# Patient Record
Sex: Male | Born: 1955 | Race: White | Marital: Married | Smoking: Former smoker
Health system: Southern US, Community
[De-identification: ages and names within clinical notes are randomized; demographics above are authoritative.]

## PROBLEM LIST (undated history)

## (undated) DIAGNOSIS — C801 Malignant (primary) neoplasm, unspecified: Secondary | ICD-10-CM

## (undated) DIAGNOSIS — I10 Essential (primary) hypertension: Secondary | ICD-10-CM

## (undated) DIAGNOSIS — N189 Chronic kidney disease, unspecified: Secondary | ICD-10-CM

## (undated) DIAGNOSIS — K5792 Diverticulitis of intestine, part unspecified, without perforation or abscess without bleeding: Secondary | ICD-10-CM

## (undated) DIAGNOSIS — M1990 Unspecified osteoarthritis, unspecified site: Secondary | ICD-10-CM

## (undated) DIAGNOSIS — M109 Gout, unspecified: Secondary | ICD-10-CM

## (undated) HISTORY — DX: Gout, unspecified: M10.9

## (undated) HISTORY — PX: NASAL ENDOSCOPY: SHX6577

## (undated) HISTORY — PX: KNEE ARTHROSCOPY: SHX127

---

## 2002-08-07 ENCOUNTER — Encounter: Payer: Self-pay | Admitting: Urology

## 2002-08-07 ENCOUNTER — Encounter: Admission: RE | Admit: 2002-08-07 | Discharge: 2002-08-07 | Payer: Self-pay | Admitting: Urology

## 2002-08-21 ENCOUNTER — Encounter: Payer: Self-pay | Admitting: Internal Medicine

## 2002-08-21 ENCOUNTER — Encounter: Admission: RE | Admit: 2002-08-21 | Discharge: 2002-08-21 | Payer: Self-pay | Admitting: Internal Medicine

## 2002-10-12 HISTORY — PX: LAPAROSCOPIC SIGMOID COLECTOMY: SHX5928

## 2002-11-03 ENCOUNTER — Encounter: Admission: RE | Admit: 2002-11-03 | Discharge: 2002-11-03 | Payer: Self-pay | Admitting: Internal Medicine

## 2002-11-03 ENCOUNTER — Encounter: Payer: Self-pay | Admitting: Internal Medicine

## 2003-04-03 ENCOUNTER — Encounter: Payer: Self-pay | Admitting: General Surgery

## 2003-04-03 ENCOUNTER — Encounter: Admission: RE | Admit: 2003-04-03 | Discharge: 2003-04-03 | Payer: Self-pay | Admitting: General Surgery

## 2003-05-11 ENCOUNTER — Encounter: Payer: Self-pay | Admitting: General Surgery

## 2003-05-18 ENCOUNTER — Inpatient Hospital Stay (HOSPITAL_COMMUNITY): Admission: RE | Admit: 2003-05-18 | Discharge: 2003-05-22 | Payer: Self-pay | Admitting: General Surgery

## 2003-05-18 ENCOUNTER — Encounter (INDEPENDENT_AMBULATORY_CARE_PROVIDER_SITE_OTHER): Payer: Self-pay | Admitting: Specialist

## 2009-01-04 ENCOUNTER — Ambulatory Visit: Payer: Self-pay | Admitting: Internal Medicine

## 2009-01-04 DIAGNOSIS — Z860100 Personal history of colon polyps, unspecified: Secondary | ICD-10-CM | POA: Insufficient documentation

## 2009-01-04 DIAGNOSIS — E785 Hyperlipidemia, unspecified: Secondary | ICD-10-CM | POA: Insufficient documentation

## 2009-01-04 DIAGNOSIS — F411 Generalized anxiety disorder: Secondary | ICD-10-CM | POA: Insufficient documentation

## 2009-01-04 DIAGNOSIS — E669 Obesity, unspecified: Secondary | ICD-10-CM | POA: Insufficient documentation

## 2009-01-04 DIAGNOSIS — M109 Gout, unspecified: Secondary | ICD-10-CM | POA: Insufficient documentation

## 2009-01-04 DIAGNOSIS — J309 Allergic rhinitis, unspecified: Secondary | ICD-10-CM | POA: Insufficient documentation

## 2009-01-04 DIAGNOSIS — F528 Other sexual dysfunction not due to a substance or known physiological condition: Secondary | ICD-10-CM | POA: Insufficient documentation

## 2009-01-04 DIAGNOSIS — I10 Essential (primary) hypertension: Secondary | ICD-10-CM | POA: Insufficient documentation

## 2009-02-05 ENCOUNTER — Ambulatory Visit: Payer: Self-pay | Admitting: Internal Medicine

## 2009-02-05 LAB — CONVERTED CEMR LAB
ALT: 19 units/L (ref 0–53)
AST: 15 units/L (ref 0–37)
Albumin: 4.4 g/dL (ref 3.5–5.2)
Alkaline Phosphatase: 47 units/L (ref 39–117)
BUN: 21 mg/dL (ref 6–23)
Basophils Absolute: 0 10*3/uL (ref 0.0–0.1)
Basophils Relative: 0.5 % (ref 0.0–3.0)
Bilirubin, Direct: 0 mg/dL (ref 0.0–0.3)
CO2: 29 meq/L (ref 19–32)
Calcium: 9.7 mg/dL (ref 8.4–10.5)
Chloride: 107 meq/L (ref 96–112)
Cholesterol: 146 mg/dL (ref 0–200)
Creatinine, Ser: 1 mg/dL (ref 0.4–1.5)
Eosinophils Absolute: 0.2 10*3/uL (ref 0.0–0.7)
Eosinophils Relative: 3.8 % (ref 0.0–5.0)
GFR calc non Af Amer: 82.85 mL/min (ref >60)
Glucose, Bld: 99 mg/dL (ref 70–99)
HCT: 39.3 % (ref 39.0–52.0)
HDL: 28.9 mg/dL — ABNORMAL LOW (ref >39.00)
Hemoglobin: 13.8 g/dL (ref 13.0–17.0)
Hgb A1c MFr Bld: 5.4 % (ref 4.6–6.5)
LDL Cholesterol: 78 mg/dL (ref 0–99)
Lymphocytes Relative: 29.6 % (ref 12.0–46.0)
Lymphs Abs: 1.5 10*3/uL (ref 0.7–4.0)
MCHC: 35 g/dL (ref 30.0–36.0)
MCV: 94.5 fL (ref 78.0–100.0)
Monocytes Absolute: 0.5 10*3/uL (ref 0.1–1.0)
Monocytes Relative: 9.8 % (ref 3.0–12.0)
Neutro Abs: 3 10*3/uL (ref 1.4–7.7)
Neutrophils Relative %: 56.3 % (ref 43.0–77.0)
Platelets: 224 10*3/uL (ref 150.0–400.0)
Potassium: 4.9 meq/L (ref 3.5–5.1)
RBC: 4.16 M/uL — ABNORMAL LOW (ref 4.22–5.81)
RDW: 12.3 % (ref 11.5–14.6)
Sodium: 142 meq/L (ref 135–145)
TSH: 1.57 microintl units/mL (ref 0.35–5.50)
Total Bilirubin: 1.2 mg/dL (ref 0.3–1.2)
Total CHOL/HDL Ratio: 5
Total Protein: 7.6 g/dL (ref 6.0–8.3)
Triglycerides: 198 mg/dL — ABNORMAL HIGH (ref 0.0–149.0)
VLDL: 39.6 mg/dL (ref 0.0–40.0)
WBC: 5.2 10*3/uL (ref 4.5–10.5)

## 2009-02-08 ENCOUNTER — Ambulatory Visit: Payer: Self-pay | Admitting: Internal Medicine

## 2009-08-12 ENCOUNTER — Telehealth: Payer: Self-pay | Admitting: Internal Medicine

## 2009-08-13 ENCOUNTER — Ambulatory Visit: Payer: Self-pay | Admitting: Internal Medicine

## 2009-08-14 ENCOUNTER — Encounter: Payer: Self-pay | Admitting: Internal Medicine

## 2009-08-14 LAB — CONVERTED CEMR LAB
ALT: 10 units/L (ref 0–53)
AST: 12 units/L (ref 0–37)
Albumin: 4.6 g/dL (ref 3.5–5.2)
Alkaline Phosphatase: 42 units/L (ref 39–117)
Bilirubin, Direct: 0.1 mg/dL (ref 0.0–0.3)
Cholesterol: 130 mg/dL (ref 0–200)
HDL: 33 mg/dL — ABNORMAL LOW (ref >39)
Indirect Bilirubin: 0.3 mg/dL (ref 0.0–0.9)
LDL Cholesterol: 46 mg/dL (ref 0–99)
Total Bilirubin: 0.4 mg/dL (ref 0.3–1.2)
Total CHOL/HDL Ratio: 3.9
Total Protein: 7 g/dL (ref 6.0–8.3)
Triglycerides: 254 mg/dL — ABNORMAL HIGH (ref <150)
VLDL: 51 mg/dL — ABNORMAL HIGH (ref 0–40)

## 2009-08-16 ENCOUNTER — Ambulatory Visit: Payer: Self-pay | Admitting: Internal Medicine

## 2010-01-17 ENCOUNTER — Encounter: Payer: Self-pay | Admitting: Internal Medicine

## 2010-01-17 LAB — CONVERTED CEMR LAB
BUN: 19 mg/dL (ref 6–23)
CO2: 23 meq/L (ref 19–32)
Calcium: 9 mg/dL (ref 8.4–10.5)
Chloride: 105 meq/L (ref 96–112)
Cholesterol: 133 mg/dL (ref 0–200)
Creatinine, Ser: 1.02 mg/dL (ref 0.40–1.50)
Glucose, Bld: 114 mg/dL — ABNORMAL HIGH (ref 70–99)
HDL: 30 mg/dL — ABNORMAL LOW (ref >39)
LDL Cholesterol: 49 mg/dL (ref 0–99)
Potassium: 4.2 meq/L (ref 3.5–5.3)
Sodium: 140 meq/L (ref 135–145)
Total CHOL/HDL Ratio: 4.4
Triglycerides: 270 mg/dL — ABNORMAL HIGH (ref <150)
VLDL: 54 mg/dL — ABNORMAL HIGH (ref 0–40)

## 2010-01-27 ENCOUNTER — Ambulatory Visit: Payer: Self-pay | Admitting: Internal Medicine

## 2010-01-27 DIAGNOSIS — R7309 Other abnormal glucose: Secondary | ICD-10-CM | POA: Insufficient documentation

## 2010-03-11 ENCOUNTER — Telehealth: Payer: Self-pay | Admitting: Internal Medicine

## 2010-04-28 ENCOUNTER — Encounter: Payer: Self-pay | Admitting: Internal Medicine

## 2010-04-28 LAB — CONVERTED CEMR LAB
BUN: 27 mg/dL — ABNORMAL HIGH (ref 6–23)
Calcium: 9 mg/dL (ref 8.4–10.5)
Cholesterol: 123 mg/dL (ref 0–200)
Glucose, Bld: 113 mg/dL — ABNORMAL HIGH (ref 70–99)
Potassium: 4.4 meq/L (ref 3.5–5.3)
Sodium: 141 meq/L (ref 135–145)
Triglycerides: 313 mg/dL — ABNORMAL HIGH (ref <150)

## 2010-04-29 ENCOUNTER — Telehealth: Payer: Self-pay | Admitting: Internal Medicine

## 2010-05-01 ENCOUNTER — Telehealth: Payer: Self-pay | Admitting: Internal Medicine

## 2010-05-02 ENCOUNTER — Ambulatory Visit: Payer: Self-pay | Admitting: Internal Medicine

## 2010-05-08 ENCOUNTER — Telehealth: Payer: Self-pay | Admitting: Internal Medicine

## 2010-05-09 ENCOUNTER — Telehealth (INDEPENDENT_AMBULATORY_CARE_PROVIDER_SITE_OTHER): Payer: Self-pay | Admitting: *Deleted

## 2010-08-11 ENCOUNTER — Encounter: Payer: Self-pay | Admitting: Internal Medicine

## 2010-10-22 ENCOUNTER — Telehealth: Payer: Self-pay | Admitting: Internal Medicine

## 2010-10-24 LAB — CONVERTED CEMR LAB
BUN: 41 mg/dL — ABNORMAL HIGH (ref 6–23)
Bilirubin, Direct: <0.1 mg/dL (ref 0.0–0.3)
CO2: 21 meq/L (ref 19–32)
Glucose, Bld: 120 mg/dL — ABNORMAL HIGH (ref 70–99)
Hgb A1c MFr Bld: 5.9 % — ABNORMAL HIGH (ref <5.7)
Potassium: 5 meq/L (ref 3.5–5.3)
Sodium: 141 meq/L (ref 135–145)
TSH: 1.719 microintl units/mL (ref 0.350–4.500)
Total Bilirubin: 0.4 mg/dL (ref 0.3–1.2)
Total CHOL/HDL Ratio: 5.5

## 2010-10-27 ENCOUNTER — Ambulatory Visit
Admission: RE | Admit: 2010-10-27 | Discharge: 2010-10-27 | Payer: Self-pay | Source: Home / Self Care | Attending: Internal Medicine | Admitting: Internal Medicine

## 2010-11-10 ENCOUNTER — Encounter: Payer: Self-pay | Admitting: Internal Medicine

## 2010-11-11 ENCOUNTER — Telehealth: Payer: Self-pay | Admitting: Internal Medicine

## 2010-11-11 LAB — CONVERTED CEMR LAB
Chloride: 105 meq/L (ref 96–112)
Creatinine, Ser: 1.12 mg/dL (ref 0.40–1.50)
Potassium: 4.2 meq/L (ref 3.5–5.3)
Sodium: 137 meq/L (ref 135–145)

## 2010-11-11 NOTE — Progress Notes (Signed)
Summary: Metformin clarification  Phone Note From Pharmacy   Caller: [facility] Call For: Dr Artist Pais  Summary of Call: Recieved fax from [facility] requesting clarification on patient's Metformin rx.  They received refill on 7/21 for three times a day dosing and refill 7/22 for two times a day dosing.  Spoke to Merrill @ [phone] re: order [id number] and verified that per office note on 05/02/10 Dr Artist Pais changed directions to 1 tablet two times a day.  Arlys John states that the script from 7/21 had already been shipped to pt and they would add new directions to pt's profile for future refills.  Spoke to pt and advised him that directions on bottle will be incorrect and to only take med two times a day as directed by Dr. Artist Pais on 05/02/10.  Pt voices understanding.  Nicki Guadalajara Fergerson CMA Duncan Dull)  May 08, 2010 2:49 PM

## 2010-11-11 NOTE — Miscellaneous (Signed)
Summary: Orders Update  Clinical Lists Changes  Orders: Added new Test order of TLB-Lipid Panel ([id number]-LIPID) - Signed Added new Test order of TLB-BMP (Basic Metabolic Panel-BMET) (80048-METABOL) - Signed

## 2010-11-11 NOTE — Progress Notes (Signed)
Summary: mail order--allopurinol, lisinopril, metformin & lorazepam  Phone Note Refill Request Message from:  Fax from [facility] on May 01, 2010 10:56 AM  Refills Requested: Medication #1:  LISINOPRIL 40 MG TABS Take 1 tablet by mouth every morning   Dosage confirmed as above?Dosage Confirmed   Supply Requested: 3 months  Medication #2:  ALLOPURINOL 300 MG TABS Take 1 tablet by mouth once a day   Dosage confirmed as above?Dosage Confirmed   Supply Requested: 3 months  Medication #3:  METFORMIN HCL 500 MG TABS 1/2 by mouth two times a day x 7 days   Dosage confirmed as above?Dosage Confirmed   Supply Requested: 3 months  Medication #4:  LORAZEPAM 1 MG TABS 1 to one tab by mouth qd   Dosage confirmed as above?Dosage Confirmed   Supply Requested: 3 months Next Appointment Scheduled: 05/02/10  Dr Artist Pais Initial call taken by: Mervin Kung CMA Duncan Dull),  May 01, 2010 10:57 AM    Prescriptions: LORAZEPAM 1 MG TABS (LORAZEPAM) 1 to one tab by mouth qd  #90 x 0   Entered by:   Mervin Kung CMA (AAMA)   Authorized by:   D. Thomos Lemons DO   Signed by:   Mervin Kung CMA (AAMA) on 05/01/2010   Method used:   Telephoned to ...       [address] [facility] (mail-order)       [address]       [place], [place]  [address]       Ph: [phone]       Fax: [phone]   RxID:   [id number] METFORMIN HCL 500 MG TABS (METFORMIN HCL) 1/2 by mouth two times a day x 7 days,  then one by mouth three times a day  #270 x 0   Entered by:   Mervin Kung CMA (AAMA)   Authorized by:   D. Thomos Lemons DO   Signed by:   Mervin Kung CMA (AAMA) on 05/01/2010   Method used:   Faxed to ...       [address] [facility] (mail-order)       [address]       [place], [place]  [address]       Ph: [phone]       Fax: [phone]   RxID:   [id number] ALLOPURINOL 300 MG TABS (ALLOPURINOL) Take 1 tablet by mouth once a day  #90 x 0   Entered by:   Mervin Kung CMA (AAMA)   Authorized by:   D.  Thomos Lemons DO   Signed by:   Mervin Kung CMA (AAMA) on 05/01/2010   Method used:   Faxed to ...       [facility] (mail-order)       [address]       [place], [place]  [address]       Ph: [phone]       Fax: [phone]   RxID:   [id number] LISINOPRIL 40 MG TABS (LISINOPRIL) Take 1 tablet by mouth every morning  #90 x 0   Entered by:   Mervin Kung CMA (AAMA)   Authorized by:   D. Thomos Lemons DO   Signed by:   Mervin Kung CMA (AAMA) on 05/01/2010   Method used:   Faxed to ...       [facility] (mail-order)       [address]       [place], [place]  [address]  Ph: [phone]       Fax: [phone]   RxID:   [id number]

## 2010-11-11 NOTE — Miscellaneous (Signed)
Summary: Medication Refills  Clinical Lists Changes  Medications: Rx of CHLORTHALIDONE 25 MG TABS (CHLORTHALIDONE) Take 1 tablet by mouth every morning;  #90 x 1;  Signed;  Entered by: Glendell Docker CMA;  Authorized by: D. Thomos Lemons DO;  Method used: Telephoned to [facility], [place], [place]  [address], Ph: [phone], Fax: [phone] Rx of CITALOPRAM HYDROBROMIDE 20 MG TABS (CITALOPRAM HYDROBROMIDE) Take 1 tablet by mouth every morning;  #90 x 1;  Signed;  Entered by: Glendell Docker CMA;  Authorized by: D. Thomos Lemons DO;  Method used: Telephoned to [facility], [place], [place]  [address], Ph: [phone], Fax: [phone]    Prescriptions: CITALOPRAM HYDROBROMIDE 20 MG TABS (CITALOPRAM HYDROBROMIDE) Take 1 tablet by mouth every morning  #90 x 1   Entered by:   Glendell Docker CMA   Authorized by:   D. Thomos Lemons DO   Signed by:   Glendell Docker CMA on 08/11/2010   Method used:   Telephoned to ...       [facility] (mail-order)       [address]       [place], [place]  [address]       Ph: [phone]       Fax: [phone]   RxID:   [id number] CHLORTHALIDONE 25 MG TABS (CHLORTHALIDONE) Take 1 tablet by mouth every morning  #90 x 1   Entered by:   Glendell Docker CMA   Authorized by:   D. Thomos Lemons DO   Signed by:   Glendell Docker CMA on 08/11/2010   Method used:   Telephoned to ...       [address] [facility] (mail-order)       [address]       [place], [place]  [address]       Ph: [phone]       Fax: [phone]   RxID:   [id number]

## 2010-11-11 NOTE — Assessment & Plan Note (Signed)
Summary: 6 month follow up/mhf, resched- jr   Vital Signs:  Patient profile:   55 year old male Height:      70.5 inches Weight:      296.50 pounds BMI:     42.09 O2 Sat:      99 % on Room air Temp:     97.9 degrees F oral Pulse rate:   68 / minute Pulse rhythm:   regular Resp:     20 per minute BP sitting:   132 / 60  (right arm) Cuff size:   large  Vitals Entered By: Glendell Docker CMA (January 27, 2010 11:43 AM)  O2 Flow:  Room air CC: Rm 2-  6 month follow up disease management Comments discuss labs   Primary Care Provider:  D. Thomos Lemons DO  CC:  Rm 2-  6 month follow up disease management.  History of Present Illness:  Hyperlipidemia Follow-Up      This is a 55 year old man who presents for Hyperlipidemia follow-up.  The patient denies muscle aches and abdominal pain.  The patient denies the following symptoms: chest pain/pressure.  Compliance with medications (by patient report) has been intermittent.  Dietary compliance has been poor.  The patient reports no exercise.    htn - stable  obesity - no dietary change.  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   Neck:  No deformities, masses, or tenderness noted.no carotid bruits.   Lungs:  normal respiratory effort and normal breath sounds.   Heart:   normal rate, regular rhythm, no murmur, and no gallop.   Abdomen:  soft, non-tender, and normal bowel sounds.   Extremities:  trace left pedal edema and trace right pedal edema.   Neurologic:  cranial nerves II-XII intact and gait normal.   Psych:  normally interactive, good eye contact, not anxious appearing, and not depressed appearing.     Impression & Recommendations:  Problem # 1:  HYPERGLYCEMIA (ICD-790.29) Pt counseled on diet and exercise.  add metformin  His updated medication list for this problem includes:    Metformin Hcl 500 Mg Tabs (Metformin hcl) .Marland Kitchen... 1/2 by mouth two times a day x 7 days,  then one by mouth three times a day  Labs Reviewed: Creat: 1.02 (01/17/2010)     Problem # 2:  OBESITY, UNSPECIFIED (ICD-278.00) pt with additional wt gain.  wt loss strategies reviewed  Ht: 70.5 (01/27/2010)   Wt: 296.50 (01/27/2010)   BMI: 42.09 (01/27/2010)  Problem # 3:  HYPERTENSION (ICD-401.9) stable.  Maintain current medication regimen.  His updated medication list for this problem includes:    Chlorthalidone 25 Mg Tabs (Chlorthalidone) .Marland Kitchen... Take 1 tablet by mouth every morning    Lisinopril 40 Mg Tabs (Lisinopril) .Marland Kitchen... Take 1 tablet by mouth every morning  BP today: 132/60 Prior BP: 140/70 (08/16/2009)  Labs Reviewed:  K+: 4.2 (01/17/2010) Creat: : 1.02 (01/17/2010)   Chol: 133 (01/17/2010)   HDL: 30 (01/17/2010)   LDL: 49 (01/17/2010)   TG: 270 (01/17/2010)  Problem # 4:  HYPERLIPIDEMIA (ICD-272.4) I suspect hyperglycemia exacerbating elevated triglycerides.   hopefully metformin and lifestyle changes will help  His updated medication list for this problem includes:    Gemfibrozil 600 Mg Tabs (Gemfibrozil) .Marland Kitchen... Take 1 tablet by mouth two times a day    Lovaza 1 Gm Caps (Omega-3-acid ethyl esters) .Marland Kitchen... 2 caps by mouth bid  Labs Reviewed: SGOT: 12 (08/14/2009)   SGPT: 10 (08/14/2009)   HDL:30 (01/17/2010), 33 (08/14/2009)  LDL:49 (01/17/2010), 46  (56/21/3086)  Chol:133 (01/17/2010), 130 (08/14/2009)  Trig:270 (01/17/2010), 254 (08/14/2009)  Complete Medication List: 1)  Chlorthalidone 25 Mg Tabs (Chlorthalidone) .... Take 1 tablet by mouth every morning 2)  Lisinopril 40 Mg Tabs (Lisinopril) .... Take 1 tablet by mouth every morning 3)  Allopurinol 300 Mg Tabs (Allopurinol) .... Take 1 tablet by mouth once a day 4)  Citalopram Hydrobromide 20 Mg Tabs (Citalopram hydrobromide) .... Take 1 tablet by mouth every morning 5)  Gemfibrozil 600 Mg Tabs (Gemfibrozil) .... Take 1 tablet by mouth two times a day 6)  Lorazepam 1 Mg Tabs (Lorazepam) .Marland Kitchen.. 1 to one tab by mouth qd 7)  Fluticasone Propionate 50 Mcg/act Susp (Fluticasone propionate) .... One spray each nostril as needed once daily 8)  Triam .1cr1/cetaphil Lot3  .... Apply after bathing 9)  Metformin Hcl 500 Mg Tabs (Metformin hcl) .... 1/2 by mouth two times a day x 7 days,  then one by mouth three times a day 10)  Lovaza 1 Gm Caps (Omega-3-acid ethyl esters) .... 2 caps by mouth bid  Patient Instructions: 1)  Please schedule a follow-up appointment in 3 months. 2)  BMP prior to visit, ICD-9:  401.9 3)  HbgA1C prior to visit, ICD-9:  272.4 4)  FLP:  272.4 5)  Please return for lab work one (1) week before your next appointment.  Prescriptions: LOVAZA 1 GM CAPS (OMEGA-3-ACID ETHYL ESTERS) 2 caps by mouth bid  #120 x 3   Entered and Authorized by:   D. Thomos Lemons DO   Signed by:   D. Thomos Lemons DO on 01/27/2010   Method used:   Electronically to        [facility] [address] [facility] (retail)       [address]       [place], [place]  [address]       Ph: [phone] or [phone]       Fax: [phone]   RxID:   [id number] METFORMIN HCL 500 MG TABS (METFORMIN HCL) 1/2 by mouth two times a day x 7 days,  then one by mouth three times a day  #90 x 2   Entered and Authorized by:   D. Thomos Lemons DO   Signed by:   D. Thomos Lemons DO on 01/27/2010   Method  used:   Electronically to        [facility] [address] [facility] (retail)       [address]       [place], [place]  [address]       Ph: [phone] or [phone]       Fax: [phone]   RxID:   [id number]

## 2010-11-11 NOTE — Progress Notes (Signed)
Summary: refill-- chlorthalidone, citalopram and Lovaza  Phone Note Refill Request Message from:  Fax from [facility]  Fax [phone] on April 29, 2010 12:07 PM  Refills Requested: Medication #1:  CHLORTHALIDONE 25 MG TABS Take 1 tablet by mouth every morning   Supply Requested: 3 months  Medication #2:  CITALOPRAM HYDROBROMIDE 20 MG TABS Take 1 tablet by mouth every morning   Dosage confirmed as above?Dosage Confirmed   Supply Requested: 3 months  Medication #3:  LOVAZA 1 GM CAPS 2 caps by mouth bid.   Dosage confirmed as above?Dosage Confirmed   Supply Requested: 3 months Requesting 3 month supply with additional refills.  Next Appointment Scheduled: 05/02/10--Dr Artist Pais Initial call taken by: Mervin Kung CMA Duncan Dull),  April 29, 2010 12:08 PM  Follow-up for Phone Call        ok for refill  Follow-up by: D. Thomos Lemons DO,  April 29, 2010 12:14 PM    Prescriptions: LOVAZA 1 GM CAPS (OMEGA-3-ACID ETHYL ESTERS) 2 caps by mouth bid  #360 x 0   Entered by:   Mervin Kung CMA (AAMA)   Authorized by:   D. Thomos Lemons DO   Signed by:   Mervin Kung CMA (AAMA) on 04/29/2010   Method used:   Faxed to ...       [facility] (mail-order)       [address]       [place], [place]  [address]       Ph: [phone]       Fax: [phone]   RxID:   [id number] CITALOPRAM HYDROBROMIDE 20 MG TABS (CITALOPRAM HYDROBROMIDE) Take 1 tablet by mouth every morning  #90 x 0   Entered by:   Mervin Kung CMA (AAMA)   Authorized by:   D. Thomos Lemons DO   Signed by:   Mervin Kung CMA (AAMA) on 04/29/2010   Method used:   Faxed to ...       [facility] (mail-order)       [address]       [place], [place]  [address]       Ph: [phone]       Fax: [phone]   RxID:   [id number] CHLORTHALIDONE 25 MG TABS (CHLORTHALIDONE) Take 1 tablet by mouth every morning  #90 x 0   Entered by:   Mervin Kung CMA (AAMA)   Authorized by:   D. Thomos Lemons DO   Signed by:    Mervin Kung CMA (AAMA) on 04/29/2010   Method used:   Faxed to ...       [address] [facility] (mail-order)       [address]       [place], [place]  [address]       Ph: [phone]       Fax: [phone]   RxID:   [id number]

## 2010-11-11 NOTE — Miscellaneous (Signed)
Summary: Lab Orders  Clinical Lists Changes  Orders: Added new Test order of T-Basic Metabolic Panel [id number]) - Signed Added new Test order of T- Hemoglobin A1C ([id number]) - Signed Added new Test order of T-Lipid Profile ([id number]) - Signed

## 2010-11-11 NOTE — Progress Notes (Signed)
Summary: lorazepam refill  Phone Note Refill Request Message from:  Received fax from [facility] [phone] on Mar 11, 2010 10:58 AM  Refills Requested: Medication #1:  LORAZEPAM 1 MG TABS 1 to one tab by mouth qd   Supply Requested: 1 month  Follow-up for Phone Call        ok to refill x 3 Follow-up by: D. Thomos Lemons DO,  March 12, 2010 12:14 PM  Additional Follow-up for Phone Call Additional follow up Details #1::        Left message on pharmacy voicemail ok to add 2 additional refills per Dr. Artist Pais.  Nicki Guadalajara Fergerson CMA  March 12, 2010 2:17 PM     Prescriptions: LORAZEPAM 1 MG TABS (LORAZEPAM) 1 to one tab by mouth qd  #30 x 1   Entered by:   Mervin Kung CMA   Authorized by:   D. Thomos Lemons DO   Signed by:   Mervin Kung CMA on 03/11/2010   Method used:   Telephoned to ...       [facility] [address] (retail)       [address]       [place], [place]  [address]       Ph: [phone] or [phone]       Fax: [phone]   RxID:   [id number]

## 2010-11-11 NOTE — Progress Notes (Signed)
Summary: Gemfibrozil  Phone Note Call from Patient Call back at [phone]   Caller: Patient Summary of Call: Pt states that [facility] does not show Gemfibrozil on their list of meds he received Initial call taken by: Lannette Donath,  May 09, 2010 12:12 PM  Follow-up for Phone Call        Spoke to [place] at [facility] and she verified they received our rx on 05/02/10. Med was shipped in a separate order and pt should receive it around Monday or Tuesday next week.  Pt notified.  Nicki Guadalajara Fergerson CMA Duncan Dull)  May 09, 2010 2:59 PM

## 2010-11-11 NOTE — Assessment & Plan Note (Signed)
Summary: 3 month follow up/mhf-- Rm 2   Vital Signs:  Patient profile:   55 year old male Height:      70.5 inches Weight:      299.25 pounds BMI:     42.48 Temp:     97.6 degrees F oral Pulse rate:   90 / minute Pulse rhythm:   regular Resp:     18 per minute BP sitting:   138 / 62  (right arm) Cuff size:   large  Vitals Entered By: Mervin Kung CMA Duncan Dull) (May 02, 2010 3:37 PM) CC: Room  2  3 month follow up. Pt will need local refill on Allopurinol. Is Patient Diabetic? Yes   Primary Care Provider:  Dondra Spry DO  CC:  Room  2  3 month follow up. Pt will need local refill on Allopurinol.Marland Kitchen  History of Present Illness:  Hypertension Follow-Up      This is a 55 year old man who presents for Hypertension follow-up.  The patient denies lightheadedness and edema.  The patient denies the following associated symptoms: chest pain.  Compliance with medications (by patient report) has been near 100%.  The patient reports that dietary compliance has been fair.  The patient reports no exercise.    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   Neck:  No deformities, masses, or tenderness noted.no carotid bruits.   Lungs:  normal respiratory effort and normal breath sounds.   Heart:  normal rate, regular rhythm, no murmur, and no gallop.     Extremities:  trace left pedal edema and trace right pedal edema.     Impression & Recommendations:  Problem # 1:  OBESITY, UNSPECIFIED (ICD-278.00) Assessment Unchanged reviewed wt loss strategies.  pt travels often and can't stick to wt watchers.  wife is supportive Ht: 70.5 (05/02/2010)   Wt: 299.25 (05/02/2010)   BMI: 42.48 (05/02/2010)  Problem # 2:  HYPERTENSION (ICD-401.9) Assessment: Unchanged Maintain current medication regimen.  His updated medication list for this problem includes:    Chlorthalidone 25 Mg Tabs (Chlorthalidone) .Marland Kitchen... Take 1 tablet by mouth every morning    Lisinopril 40 Mg Tabs (Lisinopril) .Marland Kitchen... Take 1 tablet by mouth every morning    Furosemide 20 Mg Tabs (Furosemide) ..... One by mouth qd  Future Orders: T-Basic Metabolic Panel [id number]) ... 10/14/2010  BP today: 138/62 Prior BP: 132/60 (01/27/2010)  Labs Reviewed: K+: 4.4 (04/28/2010) Creat: : 0.90 (04/28/2010)   Chol: 123 (04/28/2010)   HDL: 27 (04/28/2010)   LDL: 33 (04/28/2010)   TG: 313 (04/28/2010)  Problem # 3:  GOUT (ICD-274.9) quiescent.  Maintain current medication regimen.  His updated medication list for this problem includes:    Allopurinol 300 Mg Tabs (Allopurinol) .Marland Kitchen... Take 1 tablet by mouth once a day  Complete Medication List: 1)  Chlorthalidone 25 Mg Tabs (Chlorthalidone) .Marland KitchenMarland KitchenMarland Kitchen  Take 1 tablet by mouth every morning 2)  Lisinopril 40 Mg Tabs (Lisinopril) .... Take 1 tablet by mouth every morning 3)  Allopurinol 300 Mg Tabs (Allopurinol) .... Take 1 tablet by mouth once a day 4)  Citalopram Hydrobromide 20 Mg Tabs (Citalopram hydrobromide) .... Take 1 tablet by mouth every morning 5)  Gemfibrozil 600 Mg Tabs (Gemfibrozil) .... Take 1 tablet by mouth two times a day 6)  Lorazepam 1 Mg Tabs (Lorazepam) .Marland Kitchen.. 1 to one tab by mouth qd 7)  Fluticasone Propionate 50 Mcg/act Susp (Fluticasone propionate) .... One spray each nostril as needed once daily 8)  Triam .1cr1/cetaphil Lot3   .... Apply after bathing 9)  Metformin Hcl 500 Mg Tabs (Metformin hcl) .... One by mouth two times a day 10)  Lovaza 1 Gm Caps (Omega-3-acid ethyl esters) .... 2 caps by mouth bid 11)  Furosemide 20 Mg Tabs (Furosemide) .... One by mouth qd  Other Orders: Future Orders: T- Hemoglobin A1C ([id number]) ... 10/14/2010  Patient Instructions: 1)  Please schedule a follow-up appointment in 6 months. 2)  BMP prior to visit, ICD-9:  401.9 3)  HbgA1C prior to visit, ICD-9: 790.29 4)  Please return for lab work one (1) week before your next appointment.  Prescriptions: METFORMIN HCL 500 MG TABS (METFORMIN HCL) one by mouth two times a day  #180 x 1   Entered and Authorized by:   D. Thomos Lemons DO   Signed by:   D. Thomos Lemons DO on 05/02/2010   Method used:   Faxed to ...       [address] [facility] (mail-order)       [address]       [place], [place]  [address]       Ph: [phone]       Fax: [phone]   RxID:   [id number] LORAZEPAM 1 MG TABS (LORAZEPAM) 1 to one tab by mouth qd  #90 x 1   Entered and Authorized by:   D. Thomos Lemons DO   Signed by:   D. Thomos Lemons DO on 05/02/2010   Method used:   Print then Mail to Patient   RxID:   [id number] LOVAZA 1 GM CAPS (OMEGA-3-ACID ETHYL ESTERS) 2 caps by mouth bid  #360 x 1   Entered and Authorized by:   D. Thomos Lemons DO   Signed by:   D. Thomos Lemons DO on 05/02/2010   Method used:   Faxed to ...       [address] [facility] (mail-order)       [address]       [place], [place]  [address]       Ph: [phone]       Fax: [phone]   RxID:   [id number] GEMFIBROZIL 600 MG TABS (GEMFIBROZIL) Take 1 tablet by mouth two times a day  #180 x 1   Entered and Authorized by:   D. Thomos Lemons DO   Signed by:   D. Thomos Lemons DO on 05/02/2010   Method used:   Faxed to ...       [facility] (mail-order)       [address]       [place], [place]  [address]       Ph: [phone]       Fax: [phone]   RxID:   [id number] CITALOPRAM HYDROBROMIDE 20 MG TABS  (CITALOPRAM HYDROBROMIDE) Take 1 tablet by mouth every morning  #90 x 1   Entered  and Authorized by:   D. Thomos Lemons DO   Signed by:   D. Thomos Lemons DO on 05/02/2010   Method used:   Faxed to ...       [address] [facility] (mail-order)       [address]       [place], [place]  [address]       Ph: [phone]       Fax: [phone]   RxID:   [id number] ALLOPURINOL 300 MG TABS (ALLOPURINOL) Take 1 tablet by mouth once a day  #90 x 1   Entered and Authorized by:   D. Thomos Lemons DO   Signed by:   D. Thomos Lemons DO on 05/02/2010   Method used:   Faxed to ...       [facility] (mail-order)       [address]       [place], [place]  [address]       Ph: [phone]       Fax: [phone]   RxID:   [id number] LISINOPRIL 40 MG TABS (LISINOPRIL) Take 1 tablet by mouth every morning  #90 x 1   Entered and Authorized by:   D. Thomos Lemons DO   Signed by:   D. Thomos Lemons DO on 05/02/2010   Method used:   Faxed to ...       [facility] (mail-order)       [address]       [place], [place]  [address]       Ph: [phone]       Fax: [phone]   RxID:   [id number] CHLORTHALIDONE 25 MG TABS (CHLORTHALIDONE) Take 1 tablet by mouth every morning  #90 x 1   Entered and Authorized by:   D. Thomos Lemons DO   Signed by:   D. Thomos Lemons DO on 05/02/2010   Method used:   Faxed to ...       [address] [facility] (mail-order)       [address]       [place], [place]  [address]       Ph: [phone]       Fax: [phone]   RxID:   [id number] FUROSEMIDE 20 MG TABS (FUROSEMIDE) one by mouth qd  #90 x 1   Entered and Authorized by:   D. Thomos Lemons DO   Signed by:   D. Thomos Lemons DO on 05/02/2010   Method used:   Electronically to        [facility] (retail)       [address]       [place], [place]  [address]       Ph: [phone] or [phone]       Fax: [phone]   RxID:   [id number]   Current Allergies (reviewed today): No known allergies

## 2010-11-11 NOTE — Medication Information (Signed)
Summary: Nonadherence with Gemfibrozil/[employer]  Nonadherence with Gemfibrozil/[employer]   Imported By: Lanelle Bal 08/18/2010 14:15:36  _____________________________________________________________________  External Attachment:    Type:   Image     Comment:   External Document

## 2010-11-13 NOTE — Progress Notes (Signed)
Summary: Bllod Work  ---- [facility] [name] ---- ---- 10/20/2010 1:37 PM, D. Thomos Lemons DO wrote: plz have pt complete labs before OV  BMP prior to visit, ICD-9:  272.4 Hepatic Panel prior to visit, ICD-9:  272.4 Lipid Panel prior to visit, ICD-9:  272.4 TSH prior to visit, ICD-9: 272.4 ------------------------------  Phone Note Outgoing Call   Call placed by: Glendell Docker CMA,  October 22, 2010 8:18 AM Call placed to: Patient Summary of Call: call was placed to patient at [phone], no answer. A detailed voice message  was left for informing patient per Dr Artist Pais instructions. Patient was informed blood is fasting and the need to have before appointment on 1/16. Message was left for patient to call if any questions. Lab work has been entered for [employer] Initial call taken by: Glendell Docker CMA,  October 22, 2010 8:21 AM

## 2010-11-13 NOTE — Assessment & Plan Note (Signed)
Summary: 6 month follow up/mhf   Vital Signs:  Patient profile:   55 year old male Height:      70.5 inches Weight:      310.25 pounds BMI:     44.05 O2 Sat:      98 % on Room air Temp:     97.6 degrees F oral Pulse rate:   93 / minute Resp:     20 per minute BP sitting:   120 / 60  (right arm) Cuff size:   large  Vitals Entered By: Glendell Docker CMA (October 27, 2010 8:11 AM)  O2 Flow:  Room air CC: 6 Month Follow up Is Patient Diabetic? No Pain Assessment Patient in pain? no      Comments c/o difficulty staying asleep, discuss medications, feels they are not working, restless and jittery during the day, and increase in wt gain   Primary Care Provider:  D. Thomos Lemons DO  CC:  6 Month Follow up.  History of Present Illness: 55 y/o white male for follow up reviewed lab results with pt his Cr is elevated pt has been taking aleve two times a day for chronic knee OA   Preventive Screening-Counseling & Management  Alcohol-Tobacco     Smoking Status: quit  Allergies (verified): No Known Drug Allergies  Past History:  Past Medical History: Diverticulosis Hx of sigmoid diverticulitis Gout   Hypertension   Colonic polyps, hx of (Tubular adenoma - 10/2002) Allergic rhinitis Hyperlipidemia  Generalized Anxiety D/O  DJD of left knee - severe  Past Surgical History: Laproscopic sigmoid colectomy 05/2003  Left Knee arthroscopy 08/2008 - Dr Lajoyce Corners      Social History: 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   Lungs:  normal respiratory effort and normal breath sounds.   Heart:  normal rate, regular rhythm, no murmur, and no gallop.   Extremities:  trace left pedal edema and trace right pedal edema.   Neurologic:  cranial nerves II-XII intact and gait normal.     Impression &  Recommendations:  Problem # 1:  HYPERTENSION (ICD-401.9) Cr.  worse due to NSAID use.  decrease lisinopril dose.  increase fluid intake.  hold lasix pt advised to stop using NSAIDs  His updated medication list for this problem includes:    Chlorthalidone 25 Mg Tabs (Chlorthalidone) .Marland Kitchen... Take 1 tablet by mouth every morning    Lisinopril 40 Mg Tabs (Lisinopril) .Marland Kitchen... Take 1/2  tablet by mouth every morning    Furosemide 20 Mg Tabs (Furosemide) ..... One by mouth qd  BP today: 120/60 Prior BP: 138/62 (05/02/2010)  Labs Reviewed: K+: 4.4 (04/28/2010) Creat: : 0.90 (04/28/2010)   Chol: 123 (04/28/2010)   HDL: 27 (04/28/2010)   LDL: 33 (04/28/2010)   TG: 313 (04/28/2010)  Problem # 2:  HYPERGLYCEMIA (ICD-790.29) hold metformin until Cr normalizes His updated medication list for this problem includes:    Metformin Hcl 500 Mg Tabs (Metformin hcl) ..... One by mouth two times a day  Labs Reviewed: Creat: 0.90 (04/28/2010)     Problem # 3:  HYPERLIPIDEMIA (ICD-272.4)  His updated medication list for this problem includes:    Gemfibrozil 600 Mg Tabs (Gemfibrozil) .Marland Kitchen... Take 1 tablet by mouth two times a day    Lovaza 1 Gm Caps (Omega-3-acid  ethyl esters) .Marland Kitchen... 2 caps by mouth bid  Labs Reviewed: SGOT: 12 (08/14/2009)   SGPT: 10 (08/14/2009)   HDL:27 (04/28/2010), 30 (01/17/2010)  LDL:33 (04/28/2010), 49 (01/17/2010)  Chol:123 (04/28/2010), 133 (01/17/2010)  Trig:313 (04/28/2010), 270 (01/17/2010)  Complete Medication List: 1)  Chlorthalidone 25 Mg Tabs (Chlorthalidone) .... Take 1 tablet by mouth every morning 2)  Lisinopril 40 Mg Tabs (Lisinopril) .... Take 1/2  tablet by mouth every morning 3)  Allopurinol 300 Mg Tabs (Allopurinol) .... Take 1 tablet by mouth once a day 4)  Citalopram Hydrobromide 20 Mg Tabs (Citalopram hydrobromide) .... Take 1 tablet by mouth every morning 5)  Gemfibrozil 600 Mg Tabs (Gemfibrozil) .... Take 1 tablet by mouth two times a day 6)  Lorazepam 1 Mg  Tabs (Lorazepam) .Marland Kitchen.. 1 to one tab by mouth qd 7)  Fluticasone Propionate 50 Mcg/act Susp (Fluticasone propionate) .... One spray each nostril as needed once daily 8)  Triam .1cr1/cetaphil Lot3  .... Apply after bathing 9)  Metformin Hcl 500 Mg Tabs (Metformin hcl) .... One by mouth two times a day 10)  Lovaza 1 Gm Caps (Omega-3-acid ethyl esters) .... 2 caps by mouth bid 11)  Furosemide 20 Mg Tabs (Furosemide) .... One by mouth qd  Patient Instructions: 1)  Hold Lasix 2)  Take 1/2 of lisinopril 3)  Hold Metformin  4)  Stop all NSAIDs (aleve) 5)  Please schedule a follow-up appointment in 1 month. 6)  BMP prior to visit, ICD-9:  790.29 7)  Please return for lab work 2 weeks   Orders Added: 1)  Est. Patient Level III [16109]   Immunization History:  Influenza Immunization History:    Influenza:  declined (10/27/2010)  Tetanus/Td Immunization History:    Tetanus/Td:  historical (01/17/2008)   Contraindications/Deferment of Procedures/Staging:    Test/Procedure: FLU VAX    Reason for deferment: patient declined   Immunization History:  Influenza Immunization History:    Influenza:  Declined (10/27/2010)  Tetanus/Td Immunization History:    Tetanus/Td:  Historical (01/17/2008)  Current Allergies (reviewed today): No known allergies

## 2010-11-19 NOTE — Miscellaneous (Signed)
Summary: Orders Update  Clinical Lists Changes  Orders: Added new Test order of T-Basic Metabolic Panel ([id number]) - Signed  

## 2010-11-19 NOTE — Progress Notes (Signed)
Summary: Medication Questions  Phone Note Call from Patient Call back at [phone]   Caller: Patient Call For: D. Thomos Lemons DO Summary of Call: patient called and left voice message after being informed of his lab results. He would like to know if he is to continue with taking the medication that was prescribed by Dr Artist Pais Initial call taken by: Glendell Docker CMA,  November 11, 2010 2:31 PM  Follow-up for Phone Call        ok to resume metformin ok to take full dose of lisinopril continue to hold lasix I suggest f/u OV with 2-4 weeks Follow-up by: D. Thomos Lemons DO,  November 11, 2010 4:27 PM  Additional Follow-up for Phone Call Additional follow up Details #1::        call returned to patient at [phone], he has been informed per Dr Artist Pais instructions. Additional Follow-up by: Glendell Docker CMA,  November 11, 2010 4:37 PM    New/Updated Medications: LISINOPRIL 40 MG TABS (LISINOPRIL) Take 1  tablet by mouth every morning METFORMIN HCL 500 MG TABS (METFORMIN HCL) one by mouth two times a day

## 2010-11-28 ENCOUNTER — Ambulatory Visit: Payer: Self-pay | Admitting: Internal Medicine

## 2011-02-27 NOTE — H&P (Signed)
   NAME:  Randy [name], Randy [name]                      ACCOUNT NO.:  [id number]   MEDICAL RECORD NO.:  [id number]                   PATIENT TYPE:  INP   LOCATION:  0003                                 FACILITY:  [facility]   PHYSICIAN:  Adolph Pollack, M.D.            DATE OF BIRTH:  April 11, 1956   DATE OF ADMISSION:  05/18/2003  DATE OF DISCHARGE:                                HISTORY & PHYSICAL   REASON FOR ADMISSION:  Elective laparoscopic sigmoid colectomy.   HISTORY OF PRESENT ILLNESS:  Randy [name] is a 55 year old male who had  recurrent bouts of sigmoid diverticulitis.  He had been treated with  antibiotics.  He was seen by Iva Boop, M.D. [facility].  A CT scan has  documented the inflammatory process and the barium enema demonstrates a  segmental involvement of the sigmoid colon with diverticular disease.  Colonoscopy also demonstrates sigmoid diverticular disease and some  hemorrhoids and benign polyps.  Because of his recurrent bouts he presents  for elective partial colectomy.  Procedure and the risks were discussed with  him in detail preoperatively.   PAST MEDICAL HISTORY:  1. Sinus disease.  2. Diverticulosis with recurrent diverticulitis.  3. Hypertension for hemorrhoids.  4. Arthritis.   MEDICATIONS:  1. Lisinopril 10 mg daily.  2. He takes a number of p.r.n. medicines for sinus disease including pseudo     ephedrine.  3. Etodolac.   SOCIAL HISTORY:  No tobacco use.  He has three to four alcoholic beverages  daily.  He works for [employer].   FAMILY HISTORY:  Positive for mother who has rectal cancer.  Father has  heart disease.   PHYSICAL EXAMINATION:  GENERAL:  Moderately obese male in no acute distress.  Pleasant, cooperative.  HEENT:  Eyes:  Extraocular movements are intact.  No icterus.  NECK:  Supple without palpable masses or thyroid enlargement.  RESPIRATORY:  Breath sounds equal and clear.  Respirations unlabored.  CARDIOVASCULAR:  Heart  demonstrates a regular rate and rhythm.  No murmur  heard.  No lower extremity edema.  ABDOMEN:  Soft, nontender, nondistended.  No scars noted.  No hernias.  No  palpable masses or organomegaly.  MUSCULOSKELETAL:  Good muscle tone and full range of motion.    IMPRESSION:  Recurrent sigmoid diverticulitis.   PLAN:  Laparoscopic possible open sigmoid colectomy.                                               Adolph Pollack, M.D.    Kari Baars  D:  05/18/2003  T:  05/18/2003  Job:  [id number]   cc:   Iva Boop, M.D. [facility]   Mosetta Putt, M.D.  [address] [place]  [place] [address]  Fax: [phone]

## 2011-02-27 NOTE — Discharge Summary (Signed)
   NAME:  Randy, [name]                      ACCOUNT NO.:  [id number]   MEDICAL RECORD NO.:  [id number]                   PATIENT TYPE:  INP   LOCATION:  0449                                 FACILITY:  [facility]   PHYSICIAN:  Adolph Pollack, M.D.            DATE OF BIRTH:  [birth year]   DATE OF ADMISSION:  05/18/2003  DATE OF DISCHARGE:  05/22/2003                                 DISCHARGE SUMMARY   PRINCIPAL DISCHARGE DIAGNOSIS:  Sigmoid diverticulitis and diverticulosis.   SECONDARY DIAGNOSES:  1. Hypertension.  2. Acute gouty arthritis.   PROCEDURE:  Laparoscopic sigmoid colectomy.   INDICATIONS/REASON FOR ADMISSION:  [name] is a 55 year old male with  recurrent bouts of sigmoid diverticulitis that has been treated with  antibiotics.  He was admitted for elective partial colectomy by way of a  laparoscopic approach.  The procedure and the risks were explained to him  preoperatively.   HOSPITAL COURSE:  He underwent the above procedure which he tolerated well.  He was started on a clear liquid diet and advanced _______.  He developed  some redness on the dorsum of the right foot consistent with acute gouty  arthritis and was treated with IV Toradol and non-steroidal's and this  improved.  By his fourth postoperative day, he was tolerating a diet, bowels  were moving, incision was clean and intact, and he was able to be  discharged.   DISPOSITION:  Discharged to home on May 22, 2003, in satisfactory  condition.   He is to take Tylox for pain, Aleve for gout, and continue his home  medications.   He was given activity restrictions, and will see me back in a few days for  staple removal.                                               Adolph Pollack, M.D.    Kari Baars  D:  06/05/2003  T:  06/05/2003  Job:  [id number]   cc:   Mosetta Putt, M.D.  [address] [place]  [place] [address]  Fax: [phone]   Iva Boop, M.D. [facility]

## 2011-02-27 NOTE — Op Note (Signed)
NAME:  Randy [name], Randy [name]                      ACCOUNT NO.:  [id number]   MEDICAL RECORD NO.:  [id number]                   PATIENT TYPE:  INP   LOCATION:  0003                                 FACILITY:  [facility]   PHYSICIAN:  Adolph Pollack, M.D.            DATE OF BIRTH:  [birth year]   DATE OF PROCEDURE:  05/18/2003  DATE OF DISCHARGE:                                 OPERATIVE REPORT   PREOPERATIVE DIAGNOSIS:  Recurrent sigmoid diverticulitis.   POSTOPERATIVE DIAGNOSIS:  Recurrent sigmoid diverticulitis.   PROCEDURE:  Laparoscopic sigmoid colectomy.   SURGEON:  Adolph Pollack, M.D.   ASSISTANT:  Ollen Gross. Carolynne Edouard, M.D.   ANESTHESIA:  General.   INDICATION:  This 55 year old male has had recurring bouts of sigmoid  diverticulitis and have been documented.  Colonoscopy and barium enema  demonstrated the sigmoid diverticula disease, and now he presents for an  elective operation.   TECHNIQUE:  He is seen in the holding area, then brought to the operating  room, placed supine on the operating table, and a general anesthetic was  administered.  A Foley catheter was placed in the bladder; then he was  placed in the lithotomy position.  The abdomen and peritoneum were sterilely  prepped and draped.  A subumbilical incision was made, incising the skin and  subcutaneous tissue until the fascia was identified, and a small incision  was made in the fascia.  The peritoneal cavity was entered bluntly and under  direct vision.  A pursestring suture of 0 Vicryl was placed around the  fascial edges.  A Hasson trocar was introduced into the peritoneal cavity,  and pneumoperitoneum was created by insufflation of CO2 gas.   A laparoscope was introduced.  Under direct vision, a 10 mm trocar was  placed in the right lower quadrant, and a 5 mm trocar was placed into the  right upper quadrant at midclavicular line.  The patient was placed in  Trendelenburg position and rotated left side up.   There was an area of the  sigmoid colon that was noted to be adherent to abdominal wall and looked  somewhat indurated.  I began mobilizing this using the harmonic scalpel, and  it came off the lateral abdominal wall fairly easily.  I then mobilized the  descending colon by dividing the white line of Toldt and medializing the  left colon.  Likewise, I mobilized the sigmoid colon down to the  rectosigmoid junction by incising the white line of Toldt.  I identified the  left ureter and kept the plane of dissection anterior to it.  I then  mobilized the medial aspect of the colonic mesentery by just dividing the  peritoneum, allowing for better mobilization and allowing for me to pull the  rectosigmoid junction out of the pelvis.  At this point, I was able to  approximate normal descending colon to an area just proximal to the  rectosigmoid junction.  It was normal.  It was well-mobilized.  I placed a 5  mm trocar in the left lower quadrant and further mobilized the descending  colon so I would not have tension on the anastomosis.  Then the proximal and  descending colon was divided with harmonic scalpel.  Once I felt I had  adequate mobilization, I then removed the 5 mm trocar from the left lower  quadrant and made an extraction incision, incising the skin, subcutaneous  tissue, and anterior rectus fascia.  I then bluntly separated the rectus  muscle and divided the peritoneum and posterior rectus sheath in this area.  I was able to extract the diseased segment of colon as well as normal  segments proximal and distal to it.  I then divided the colon at the  descending colon sigmoid junction and just proximal to the rectosigmoid  junction.  The mesentery of the colon was divided between clamps close to  the bowel wall, and the vessels were ligated.  The specimen was passed off  the field.   Next, I performed an end-to-end, single-layered, hand-sewn anastomosis with  real silk sutures,  inverting the posterior and anterior walls.  The  anastomosis was patent, viable, and under no tension.  I then irrigated out  the abdominal cavity.  I placed the anastomosis back into the abdominal  cavity and occluded it proximally.  I then placed irrigation fluid down in  this region, putting the anastomosis under water.  Proctoscopy was  performed, and air was insufflated, and the area was distended.  No leak was  noted from the anastomosis.  The air was then released and the fluid  evacuated.   We subsequently irrigated out the abdominal cavity another time with a total  of two liters of saline.  No bleeding was noted.  Sponge, needle, and  instrument counts were reported to be correct.  Gloves were changed.  The  posterior fascia and peritoneum were then closed with a running #1 PDS  suture.  The anterior fascia was closed with running #1 PDS suture.  The  subcutaneous tissue was irrigated with antibiotic solution.  We subsequently  reinsufflated the abdomen and inspected the area by way of laparoscopy and  again noted the anastomosis to be viable.  No bleeding was noted.  I then  released the pneumoperitoneum.  I removed the subumbilical trocar and then  closed the fascial defect by tightening up and tying down the pursestring  suture.  The remaining trocars were removed.  All skin incisions were closed  with staples, and sterile dressings were applied.   He tolerated the procedure well without any apparent complications.  He was  taken to the recovery room in satisfactory condition.                                               Adolph Pollack, M.D.    Kari Baars  D:  05/18/2003  T:  05/18/2003  Job:  [id number]   cc:   Iva Boop, M.D. [facility]   Mosetta Putt, M.D.  [address] [place]  [place] [address]  Fax: [phone]

## 2014-04-20 DIAGNOSIS — N183 Chronic kidney disease, stage 3 unspecified: Secondary | ICD-10-CM | POA: Diagnosis present

## 2014-11-12 HISTORY — PX: OTHER SURGICAL HISTORY: SHX169

## 2016-07-06 HISTORY — PX: OTHER SURGICAL HISTORY: SHX169

## 2016-07-31 ENCOUNTER — Encounter: Payer: Self-pay | Admitting: Radiation Oncology

## 2016-07-31 NOTE — Progress Notes (Signed)
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     Nutrition Status Yes No Comments  Weight changes? []  [x]    Swallowing concerns? []  [x]    PEG? []  [x]     Referrals Yes No Comments  Social Work? []  [x]    Dentistry? [x]  []  Dr. Katheren Shams to fashion a lower bite guard to wear at night to protect his tongue.  Swallowing therapy? []  [x]    Nutrition? []  [x]    Med/Onc? []  [x]     Safety Issues Yes No Comments  Prior radiation? []  [x]    Pacemaker/ICD? []  [x]    Possible current pregnancy? []  [x]    Is the patient on methotrexate? []  [x]     Tobacco/Marijuana/Snuff/ETOH use: yes, former extensive smoker, 2 ppd  Past/Anticipated interventions by otolaryngology, if any: yes, tiple endoscopy, left partial glossectomy with primary closure and left nck dissection levels 1 through  IV  Past/Anticipated interventions by medical oncology, if any: no  Current Complaints / other details:  60 year old male. Married. 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 Patient returned to work full time a few weeks ago. Managing pain with Tylenol. Lives in [place]. Planning a cruise to the [place] in two weeks. Scheduled to follow up with Dr. Corliss Skains in April. Explains the left side of his tongue hasn't completely healed because "the stitches came out too soon." Reports occasional pain in his tongue, left neck and left shoulder. Reports his left neck remains numb. Wears CPAP. Chewing on the right side and eating whatever he wants to without difficulty swallowing.

## 2016-08-03 ENCOUNTER — Ambulatory Visit
Admission: RE | Admit: 2016-08-03 | Discharge: 2016-08-03 | Disposition: A | Discharge status: Home / Self Care | Payer: Managed Care, Other (non HMO) | Source: Ambulatory Visit | Attending: Radiation Oncology | Admitting: Radiation Oncology

## 2016-08-03 ENCOUNTER — Telehealth: Payer: Self-pay | Admitting: *Deleted

## 2016-08-03 ENCOUNTER — Encounter: Payer: Self-pay | Admitting: Radiation Oncology

## 2016-08-03 DIAGNOSIS — C020 Malignant neoplasm of dorsal surface of tongue: Secondary | ICD-10-CM | POA: Diagnosis not present

## 2016-08-03 DIAGNOSIS — Z87891 Personal history of nicotine dependence: Secondary | ICD-10-CM | POA: Diagnosis not present

## 2016-08-03 DIAGNOSIS — Z931 Gastrostomy status: Secondary | ICD-10-CM | POA: Diagnosis not present

## 2016-08-03 DIAGNOSIS — N189 Chronic kidney disease, unspecified: Secondary | ICD-10-CM | POA: Insufficient documentation

## 2016-08-03 DIAGNOSIS — Z510 Encounter for antineoplastic radiation therapy: Secondary | ICD-10-CM | POA: Diagnosis not present

## 2016-08-03 DIAGNOSIS — I129 Hypertensive chronic kidney disease with stage 1 through stage 4 chronic kidney disease, or unspecified chronic kidney disease: Secondary | ICD-10-CM | POA: Diagnosis not present

## 2016-08-03 DIAGNOSIS — Z800 Family history of malignant neoplasm of digestive organs: Secondary | ICD-10-CM | POA: Diagnosis not present

## 2016-08-03 DIAGNOSIS — Z79899 Other long term (current) drug therapy: Secondary | ICD-10-CM | POA: Insufficient documentation

## 2016-08-03 DIAGNOSIS — C029 Malignant neoplasm of tongue, unspecified: Secondary | ICD-10-CM

## 2016-08-03 DIAGNOSIS — K1233 Oral mucositis (ulcerative) due to radiation: Secondary | ICD-10-CM | POA: Insufficient documentation

## 2016-08-03 DIAGNOSIS — M1712 Unilateral primary osteoarthritis, left knee: Secondary | ICD-10-CM | POA: Diagnosis not present

## 2016-08-03 HISTORY — DX: Essential (primary) hypertension: I10

## 2016-08-03 HISTORY — DX: Chronic kidney disease, unspecified: N18.9

## 2016-08-03 HISTORY — DX: Diverticulitis of intestine, part unspecified, without perforation or abscess without bleeding: K57.92

## 2016-08-03 HISTORY — DX: Unspecified osteoarthritis, unspecified site: M19.90

## 2016-08-03 HISTORY — DX: Malignant (primary) neoplasm, unspecified: C80.1

## 2016-08-03 NOTE — Telephone Encounter (Signed)
CALLED PATIENT TO INFORM OF APPT. WITH DR. Ulice Bold ON 08-04-16 @ 9:30 AM, SPOKE WITH PATIENT AND HE IS AWARE OF THIS APPT.

## 2016-08-03 NOTE — Progress Notes (Signed)
See progress note under physician encounter. 

## 2016-08-03 NOTE — Progress Notes (Signed)
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 Cancer of dorsal tongue (HCC) 141.1 C02.0     HISTORY OF PRESENT ILLNESS: Randy [name] is a 60 y.o. male seen at the request of Dr. Corliss Skains for a new diagnosis of squamous cell carcinoma of the tongue. Patient indicates that he used to smoke extensively, up to 2 packs per day of cigarettes but stopped that 20 years ago. When he was a very young man he dabbled initially with chewing tobacco but has not done that in many years. He does admit to regular intake of alcoholic beverages, on a typical day he will drink 8 beers and sometimes more on weekends. 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 Interestingly, the patient also is a frequent user of alcohol containing Listerine. He has been religiously using Listerine mouthwash twice per day for many years. He also feels that a left mandibular molar has a sharp lingual cusp on it and it sometimes irritates that left side of his tongue.  For about 2 months now the patient has had a progressive sore on the left posterior lateral tongue where it abuts his left mandibular molar. It progressed and persisted and dentist saw him and sent him to Dr. Katheren Shams. Dr. Katheren Shams performed an incisional biopsy of the left posterior lateral tongue lesion 05/28/2016 and a faxed pathology report from [facility] of dentistry pathology laboratory describes "squamous cell carcinoma, well differentiated and  keratinizing with a 4 mm depth of invasion". The lesion extended to the margins of the specimen. He has been sent for consideration of definitive surgical treatment.  On 07/06/16, he was taken to the [facility] in [place] with Dr. Corliss Skains of [facility] for triple endoscopy, left partial glossectomy with primary closure and left neck dissection levels I through IV.  Final pathology revealed invasive squamous cell carcinoma, moderately differentiated measuring 1.8 x 1.2 cm. Margins were negative 3 mm from the closest margin with perineural invasion noted. In the level II-IV region, 1/32 nodes were positive. Overall, his primary tumor was measured to be 2.4 cm by his ENT leading to a stage of T2 N1.  He comes today to discuss the role for radiotherapy.   PREVIOUS RADIATION THERAPY: No  PAST MEDICAL HISTORY:  Past Medical History:  Diagnosis Date  . Arthritis    left knee  . Cancer ([place])   . Chronic kidney disease   . Diverticulitis   . Hypertension       PAST SURGICAL HISTORY: Past Surgical History:  Procedure Laterality Date  . COLON RESECTION SIGMOID    . colonscopy  11/2014  . KNEE ARTHROSCOPY Left   . left partial glossectomy with primary closure and left neck dissection    . NASAL ENDOSCOPY     x 3    FAMILY HISTORY:  Family History  Problem Relation Age of Onset  . Cancer Mother  rectal  . Cancer Maternal Grandmother     unknown  . Cancer Paternal Grandmother     unknown  . Cancer Paternal Grandfather     unknown    SOCIAL HISTORY:  Social History   Social History  . Marital status: Single    Spouse name: N/A  . Number of children: N/A  . Years of education: N/A   Occupational History  . Not on file.   Social History Main Topics  . Smoking status: Former Smoker    Packs/day: 2.00    Years: 20.00    Types: Cigarettes    Quit date: 10/13/1995  . Smokeless tobacco: Former Systems developer  . Alcohol use 24.0 oz/week    40 Cans of beer per week  . Drug use: No  . Sexual  activity: Not Currently   Other Topics Concern  . Not on file   Social History Narrative  . 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  ALLERGIES: Review of patient's allergies indicates no known allergies.  MEDICATIONS:  Current Outpatient Prescriptions  Medication Sig Dispense Refill  . allopurinol (ZYLOPRIM) 300 MG tablet Take 300 mg by mouth daily.    Marland Kitchen ALPRAZolam (XANAX) 0.5 MG tablet     . atorvastatin (LIPITOR) 10 MG tablet Take 40 mg by mouth daily.     . chlorthalidone (HYGROTON) 25 MG tablet Take 25 mg by mouth daily.    . Cholecalciferol (VITAMIN D PO) Take 50,000 Units by mouth once a week.     . citalopram (CELEXA) 20 MG tablet Take 20 mg by mouth daily.    . fenofibrate 160 MG tablet Take 160 mg by mouth daily.    Marland Kitchen lisinopril (PRINIVIL,ZESTRIL) 40 MG tablet Take 40 mg by mouth daily.    . Multiple Vitamins-Minerals (MULTIVITAMIN ADULT PO) Take by mouth.     No current facility-administered medications for this encounter.     REVIEW OF SYSTEMS:  On review of systems, the patient reports that he is doing well overall. He denies any chest pain, shortness of breath, cough, fevers, chills, night sweats, unintended weight changes. He denies any bowel or bladder disturbances, and denies abdominal pain, nausea or vomiting. Patient reports the left side of his tongue hasn't completely healed because "the stitches came out too soon." He reports occasional pain in his tongue, left neck, and left shoulder. Reports his left neck remains numb. Patient does wear a CPAP. Patient reports chewing on the right side and eating whatever he wants without difficulty swallowing. A complete review of systems is obtained and is otherwise negative. Patient reports upcoming weeklong vacation in two weeks; he wants to begin radiation after he gets back.    PHYSICAL EXAM:  height is 5\' 11"  (1.803 m) and weight is 308 lb 3.2 oz (139.8 kg) (abnormal). His blood pressure is  143/56 (abnormal) and his pulse is 50 (abnormal). His respiration is 18 and oxygen saturation is 100%.   In general this is a well appearing Caucasian gentleman in no acute distress. He is alert and oriented x4 and appropriate throughout the examination. Neck is free of lymph adenopathy and left neck scar is well healed. Oral cavity is normal in appearance, other than healing on the left dorsal tongue laterally. Tongue mobility is normal and speech is normal.  Patient reports tenderness on tongue during oral exam. EOMs are intact. PERRLA. Skin is intact without any evidence of gross lesions. Cardiovascular exam reveals a regular rate  and rhythm, no clicks rubs or murmurs are auscultated. Chest is clear to auscultation bilaterally. Lymphatic assessment is performed and does not reveal any adenopathy in the cervical, supraclavicular, axillary, or inguinal chains. Abdomen has active bowel sounds in all quadrants and is intact. The abdomen is soft, non tender, non distended. Lower extremities are negative for pretibial pitting edema, deep calf tenderness, cyanosis or clubbing.    KPS = 90  100 - Normal; no complaints; no evidence of disease. 90   - Able to carry on normal activity; minor signs or symptoms of disease. 80   - Normal activity with effort; some signs or symptoms of disease. 6   - Cares for self; unable to carry on normal activity or to do active work. 60   - Requires occasional assistance, but is able to care for most of his personal needs. 50   - Requires considerable assistance and frequent medical care. 61   - Disabled; requires special care and assistance. 66   - Severely disabled; hospital admission is indicated although death not imminent. 66   - Very sick; hospital admission necessary; active supportive treatment necessary. 10   - Moribund; fatal processes progressing rapidly. 0     - Dead  Karnofsky DA, Abelmann [place], Craver LS and Burchenal [facility] [phone]) The use of the nitrogen  mustards in the palliative treatment of carcinoma: with particular reference to bronchogenic carcinoma Cancer 1 634-56  LABORATORY DATA:  Lab Results  Component Value Date   WBC 5.2 02/05/2009   HGB 13.8 02/05/2009   HCT 39.3 02/05/2009   MCV 94.5 02/05/2009   PLT 224.0 02/05/2009   Lab Results  Component Value Date   NA 137 11/10/2010   K 4.2 11/10/2010   CL 105 11/10/2010   CO2 21 11/10/2010   Lab Results  Component Value Date   ALT 13 10/24/2010   AST 14 10/24/2010   ALKPHOS 37 (L) 10/24/2010   BILITOT 0.4 10/24/2010     RADIOGRAPHY: No results found.    IMPRESSION/PLAN: 1.  Mr. 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  He is at risk for local recurrence based on perineural invasion and surgical margin of less than 5 mm.  The presence of a single positive node without extracapsular extension also increases his stage, increasing the risk for recurrence.  At this point, the patient may benefit from adjuvant radiotherapy to reduce his risk of recurrence.  Today, I talked to the patient and family about the findings and work-up thus far.  We discussed the natural history of oral cavity cancer and general treatment, highlighting the role of radiotherapy in the management.  We discussed the available radiation techniques, and focused on the details of logistics and delivery.  We reviewed the anticipated acute and late sequelae associated with radiation in this setting.  The patient was encouraged to ask questions that I answered to the best of my ability.  The patient would like to proceed with radiation and will be scheduled for CT simulation on 08/24/16.  In the interim, we will request assessment by his primary dentist pre-radiation.  I spent time face to face with the patient and more than 50% of that time was spent in counseling and/or coordination of  care.   ------------------------------------------------   Tyler Pita, MD [place] Director and Director of Stereotactic Radiosurgery Direct Dial: [phone]  Fax:  [phone] [email or web address]  Skype  LinkedIn     and    Carola Rhine, PAC  This document serves as a record of services personally performed by Tyler Pita, MD and Shona Simpson, PA. It was created on his behalf by Maryla Morrow, a trained medical scribe. The creation of this record is based on the scribe's personal observations and the provider's statements to them. This document has been checked and approved by the attending provider.

## 2016-08-04 NOTE — Addendum Note (Signed)
Encounter addended by: Heywood Footman, RN on: 08/04/2016  2:49 PM<BR>    Actions taken: Charge Capture section accepted

## 2016-08-24 ENCOUNTER — Ambulatory Visit
Admission: RE | Admit: 2016-08-24 | Discharge: 2016-08-24 | Disposition: A | Discharge status: Home / Self Care | Payer: Managed Care, Other (non HMO) | Source: Ambulatory Visit | Attending: Radiation Oncology | Admitting: Radiation Oncology

## 2016-08-24 VITALS — BP 145/56 | HR 58 | Resp 18 | Wt 318.8 lb

## 2016-08-24 DIAGNOSIS — C020 Malignant neoplasm of dorsal surface of tongue: Secondary | ICD-10-CM

## 2016-08-24 DIAGNOSIS — Z510 Encounter for antineoplastic radiation therapy: Secondary | ICD-10-CM | POA: Diagnosis not present

## 2016-08-24 MED ORDER — SODIUM CHLORIDE 0.9% FLUSH
10.0000 mL | Freq: Once | INTRAVENOUS | Status: AC
Start: 2016-08-24 — End: 2016-08-24
  Administered 2016-08-24: 10 mL via INTRAVENOUS

## 2016-08-24 NOTE — Progress Notes (Signed)
[id number] Received patient in the clinic for IV start. 10/9 labs: BUN 24 and Creatinine 1.20. Vitals WDL. 10 lb weight gain noted. Denies pain. Started left AC 22 gauge IV on the first attempt. Excellent blood return obtained and site flushed without difficulty. Secure IV in place. Patient escorted to CT by sim staff. At [place], RN reports she removed the left [facility] 22 gauge IV. She reports the catheter was intact upon removal. She reports she applied an occlusive dressing to the IV site.   BP (!) 145/56 (BP Location: Right Arm, Patient Position: Supine, Cuff Size: Large)   Pulse (!) 58   Resp 18   Wt (!) 318 lb 12.8 oz (144.6 kg)   SpO2 99%   BMI 44.46 kg/m  Wt Readings from Last 3 Encounters:  08/24/16 (!) 318 lb 12.8 oz (144.6 kg)  08/03/16 (!) 308 lb 3.2 oz (139.8 kg)  10/27/10 (!) 310 lb 4 oz (140.7 kg)

## 2016-08-24 NOTE — Progress Notes (Signed)
  Radiation Oncology         [phone] ________________________________  Name: Randy [name] MRN: [id number]  Date: 08/24/2016  DOB: 15-Sep-1956  SIMULATION AND TREATMENT PLANNING NOTE    ICD-9-CM ICD-10-CM   1. 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  Identity was confirmed.  All relevant records and images related to the planned course of therapy were reviewed.  The patient freely provided informed written consent to proceed with treatment after reviewing the details related to the planned course of therapy. The consent form was witnessed and verified by the simulation staff.  Then, the patient was set-up in a stable reproducible  supine position for radiation therapy.  CT images were obtained.  Surface markings were placed.  The CT images were loaded into the planning software.  Then the target and avoidance structures were contoured.  Treatment planning then occurred.  The radiation prescription was entered and confirmed.  Then, I designed and supervised the construction of a total of 2 medically necessary complex treatment devices - Thermoplastic mask and foam neck pillow.  I have requested : Intensity Modulated Radiotherapy (IMRT) is medically necessary for this case for the following reason:  Parotid sparing..  I have ordered:Nutrition Consult and CBC  PLAN:  The patient will receive 60 Gy in 30 fractions.  ________________________________  Sheral Apley Tammi Klippel, M.D.

## 2016-09-02 DIAGNOSIS — Z510 Encounter for antineoplastic radiation therapy: Secondary | ICD-10-CM | POA: Diagnosis not present

## 2016-09-07 ENCOUNTER — Ambulatory Visit
Admission: RE | Admit: 2016-09-07 | Discharge: 2016-09-07 | Disposition: A | Discharge status: Home / Self Care | Payer: Managed Care, Other (non HMO) | Source: Ambulatory Visit | Attending: Radiation Oncology | Admitting: Radiation Oncology

## 2016-09-07 ENCOUNTER — Encounter: Payer: Self-pay | Admitting: Radiation Oncology

## 2016-09-07 DIAGNOSIS — Z510 Encounter for antineoplastic radiation therapy: Secondary | ICD-10-CM | POA: Diagnosis not present

## 2016-09-07 NOTE — Progress Notes (Signed)
Paperwork Psychologist, counselling) received 11/27, given to the nurse 11/28

## 2016-09-08 ENCOUNTER — Telehealth: Payer: Self-pay | Admitting: Radiation Oncology

## 2016-09-08 ENCOUNTER — Ambulatory Visit
Admission: RE | Admit: 2016-09-08 | Discharge: 2016-09-08 | Disposition: A | Discharge status: Home / Self Care | Payer: Managed Care, Other (non HMO) | Source: Ambulatory Visit | Attending: Radiation Oncology | Admitting: Radiation Oncology

## 2016-09-08 DIAGNOSIS — Z510 Encounter for antineoplastic radiation therapy: Secondary | ICD-10-CM | POA: Diagnosis not present

## 2016-09-08 NOTE — Telephone Encounter (Signed)
Placed orange folder with Oncology Treatment Plan Request form in [place], PA-C inbox. Completed questions 1-5 to the best of my ability. Requesting Bryson Ha complete 6 and 7.

## 2016-09-09 ENCOUNTER — Encounter: Payer: Self-pay | Admitting: *Deleted

## 2016-09-09 ENCOUNTER — Ambulatory Visit
Admission: RE | Admit: 2016-09-09 | Discharge: 2016-09-09 | Disposition: A | Discharge status: Home / Self Care | Payer: Managed Care, Other (non HMO) | Source: Ambulatory Visit | Attending: Radiation Oncology | Admitting: Radiation Oncology

## 2016-09-09 ENCOUNTER — Encounter: Payer: Self-pay | Admitting: Radiation Oncology

## 2016-09-09 DIAGNOSIS — Z510 Encounter for antineoplastic radiation therapy: Secondary | ICD-10-CM | POA: Diagnosis not present

## 2016-09-09 NOTE — Progress Notes (Signed)
Paperwork Psychologist, counselling) received from doctor, faxed to [facility] [phone], confirmation received, copy mailed to patient 11/29

## 2016-09-09 NOTE — Progress Notes (Signed)
Oncology Nurse Navigator Documentation  Met with Mr. Colborn for first time following Tomo tmt.  Introduced myself as H&N Navigator, briefly explained my role as a member of his Care Team.  He understands I will join him tomorrow during his WUT with Dr. Tammi Klippel.  Gayleen Orem, RN, BSN, [place] Neck Oncology Nurse [place] at [place] [phone]

## 2016-09-10 ENCOUNTER — Encounter: Payer: Self-pay | Admitting: Radiation Oncology

## 2016-09-10 ENCOUNTER — Encounter: Payer: Self-pay | Admitting: *Deleted

## 2016-09-10 ENCOUNTER — Ambulatory Visit
Admission: RE | Admit: 2016-09-10 | Discharge: 2016-09-10 | Disposition: A | Discharge status: Home / Self Care | Payer: Managed Care, Other (non HMO) | Source: Ambulatory Visit | Attending: Radiation Oncology | Admitting: Radiation Oncology

## 2016-09-10 VITALS — BP 140/59 | HR 55 | Resp 18 | Wt 309.8 lb

## 2016-09-10 DIAGNOSIS — Z510 Encounter for antineoplastic radiation therapy: Secondary | ICD-10-CM | POA: Diagnosis not present

## 2016-09-10 DIAGNOSIS — C020 Malignant neoplasm of dorsal surface of tongue: Secondary | ICD-10-CM | POA: Diagnosis not present

## 2016-09-10 MED ORDER — SONAFINE EX EMUL
1.0000 "application " | Freq: Two times a day (BID) | CUTANEOUS | Status: DC
  Administered 2016-09-10: 1 via TOPICAL

## 2016-09-10 NOTE — Progress Notes (Signed)
  Radiation Oncology         [phone]   Name: Randy [name] MRN: [id number]   Date: 09/10/2016  DOB: [birth year]     Weekly Radiation Therapy Management    ICD-9-CM ICD-10-CM   1. Cancer of dorsal tongue (HCC) 141.1 C02.0 SONAFINE emulsion 1 application    Current Dose: 8 Gy  Planned Dose:  60 Gy  Narrative The patient presents for routine under treatment assessment.  Vitals stable. Weight loss noted. Denies pain. Reports new onset of chest congestion and productive cough with clear sputum. Denies pain or difficulty associated with swallowing. Reports dry mouth. Denies skin changes within treatment field. Denies fatigue. Patient without complaints. He is now working from home.   The patient is without complaint. Set-up films were reviewed. The chart was checked.  Physical Findings  weight is 309 lb 12.8 oz (140.5 kg) (abnormal). His blood pressure is 140/59 (abnormal) and his pulse is 55 (abnormal). His respiration is 18 and oxygen saturation is 100%. . Weight loss of 9 lbs since 08/24/16. Alert, in no acute distress. No thrush and no significant mucositis noted.  Impression The patient is tolerating radiation.  Plan Continue treatment as planned. The patient met with our patient navigator, Gayleen Orem, today.          Sheral Apley Tammi Klippel, M.D.  This document serves as a record of services personally performed by Tyler Pita, MD. It was created on his behalf by Arlyce Harman, a trained medical scribe. The creation of this record is based on the scribe's personal observations and the provider's statements to them. This document has been checked and approved by the attending provider.

## 2016-09-10 NOTE — Progress Notes (Signed)
Vitals stable. Weight loss noted. Denies pain. Reports new onset of chest congestion and productive cough with clear sputum. Denies pain or difficulty associated with swallowing. Reports dry mouth. Denies skin changes within treatment field. Denies fatigue. Patient without complaints   BP (!) 140/59 (BP Location: Right Arm, Patient Position: Sitting, Cuff Size: Large)   Pulse (!) 55   Resp 18   Wt (!) 309 lb 12.8 oz (140.5 kg)   SpO2 100%   BMI 43.21 kg/m  Wt Readings from Last 3 Encounters:  09/10/16 (!) 309 lb 12.8 oz (140.5 kg)  08/24/16 (!) 318 lb 12.8 oz (144.6 kg)  08/03/16 (!) 308 lb 3.2 oz (139.8 kg)

## 2016-09-10 NOTE — Progress Notes (Signed)
Oncology Nurse Navigator Documentation  Met with Randy [name] following RT and during WUT with Dr. Tammi Klippel.  1. Further introduced myself as his Navigator, explained my role as a member of the Care Team.   2. Provided New Patient Information packet, discussed contents:  Contact information for physician(s), myself, other members of the Care Team.  Advance Directive information ([place] blue pamphlet with LCSW contact info)  Fall Prevention Patient [place] sheet  [place] campus map with highlight of [place] 3. I discussed his attendance at the 09/22/16 H&N [place] for initial meetings with PT, SLP, Nutrition, SW and Financial Counseling.  He understands he will come to [facility] following his 0820 RT, that I will provide further guidance as the date approaches. 4. I encouraged him to contact me with questions/concerns as treatments/procedures begin.  He verbalized understanding of information provided.    Gayleen Orem, RN, BSN, [place] Neck Oncology Nurse [place] at [place] [phone]

## 2016-09-11 ENCOUNTER — Ambulatory Visit
Admission: RE | Admit: 2016-09-11 | Discharge: 2016-09-11 | Disposition: A | Discharge status: Home / Self Care | Payer: Managed Care, Other (non HMO) | Source: Ambulatory Visit | Attending: Radiation Oncology | Admitting: Radiation Oncology

## 2016-09-11 DIAGNOSIS — Z510 Encounter for antineoplastic radiation therapy: Secondary | ICD-10-CM | POA: Diagnosis not present

## 2016-09-14 ENCOUNTER — Ambulatory Visit
Admission: RE | Admit: 2016-09-14 | Discharge: 2016-09-14 | Disposition: A | Discharge status: Home / Self Care | Payer: Managed Care, Other (non HMO) | Source: Ambulatory Visit | Attending: Radiation Oncology | Admitting: Radiation Oncology

## 2016-09-14 DIAGNOSIS — Z510 Encounter for antineoplastic radiation therapy: Secondary | ICD-10-CM | POA: Diagnosis not present

## 2016-09-15 ENCOUNTER — Ambulatory Visit
Admission: RE | Admit: 2016-09-15 | Discharge: 2016-09-15 | Disposition: A | Discharge status: Home / Self Care | Payer: Managed Care, Other (non HMO) | Source: Ambulatory Visit | Attending: Radiation Oncology | Admitting: Radiation Oncology

## 2016-09-15 DIAGNOSIS — Z510 Encounter for antineoplastic radiation therapy: Secondary | ICD-10-CM | POA: Diagnosis not present

## 2016-09-16 ENCOUNTER — Other Ambulatory Visit: Payer: Self-pay | Admitting: *Deleted

## 2016-09-16 ENCOUNTER — Ambulatory Visit
Admission: RE | Admit: 2016-09-16 | Discharge: 2016-09-16 | Disposition: A | Discharge status: Home / Self Care | Payer: Managed Care, Other (non HMO) | Source: Ambulatory Visit | Attending: Radiation Oncology | Admitting: Radiation Oncology

## 2016-09-16 ENCOUNTER — Encounter: Payer: Self-pay | Admitting: *Deleted

## 2016-09-16 DIAGNOSIS — Z510 Encounter for antineoplastic radiation therapy: Secondary | ICD-10-CM | POA: Diagnosis not present

## 2016-09-16 DIAGNOSIS — C020 Malignant neoplasm of dorsal surface of tongue: Secondary | ICD-10-CM

## 2016-09-16 NOTE — Progress Notes (Signed)
Oncology Nurse Navigator Documentation  Ambulatory Dental, PT, SLP, Nutrition and SW referrals placed per [place] by Dr. Tammi Klippel.  Patient to be seen by PT, SLP, Nutrition and SW at 09/22/16 H&N [place].  Gayleen Orem, RN, BSN, [place] Neck Oncology Nurse [place] at [place] [phone]

## 2016-09-17 ENCOUNTER — Ambulatory Visit
Admission: RE | Admit: 2016-09-17 | Discharge: 2016-09-17 | Disposition: A | Discharge status: Home / Self Care | Payer: Managed Care, Other (non HMO) | Source: Ambulatory Visit | Attending: Radiation Oncology | Admitting: Radiation Oncology

## 2016-09-17 ENCOUNTER — Encounter: Payer: Self-pay | Admitting: Radiation Oncology

## 2016-09-17 VITALS — BP 128/70 | HR 56 | Temp 98.4°F | Ht 71.0 in | Wt 306.2 lb

## 2016-09-17 DIAGNOSIS — C020 Malignant neoplasm of dorsal surface of tongue: Secondary | ICD-10-CM

## 2016-09-17 DIAGNOSIS — Z510 Encounter for antineoplastic radiation therapy: Secondary | ICD-10-CM | POA: Diagnosis not present

## 2016-09-17 NOTE — Progress Notes (Signed)
  Radiation Oncology         [phone]   Name: Randy [name] MRN: [id number]   Date: 09/17/2016  DOB: [birth year]     Weekly Radiation Therapy Management    ICD-9-CM ICD-10-CM   1. Cancer of dorsal tongue (HCC) 141.1 C02.0     Current Dose: 18 Gy  Planned Dose:  60 Gy  Narrative The patient presents for routine under treatment assessment.  Mr. Randy [name] presents for his 9th fraction of radiation to his Tongue and head and neck. He reports pain a 5/10 to his mouth. He does have ulcers to the inside of his bottom lip which have been present for several days. He reports pain a 2/10 in his throat when he swallows and when he wakes up in the morning. He reports thick saliva, dry mouth, and taste changes. He states he has a "head cold" with congestion, and a productive cough with clear sputum. He is able to eat anything he can swallow, and tells me he has not changed his food intake since starting radiation. He is not drinking any nutritional supplements at this time. He is drinking about 5-7 sixteen ounce bottles of water daily, maybe more. His neck is red, with a rash since before his surgery present to the right side of his neck. He is using sonafine twice daily.    The patient is without complaint. Set-up films were reviewed. The chart was checked.  Physical Findings  height is 5\' 11"  (1.803 m) and weight is 306 lb 3.2 oz (138.9 kg) (abnormal). His temperature is 98.4 F (36.9 C). His blood pressure is 128/70 and his pulse is 56 (abnormal). His oxygen saturation is 99%. . Weight loss of 12 lbs since 08/24/16. Alert, in no acute distress. Patient has some irritation of the lower lip with no significant mucositis or thrush.  Impression The patient is tolerating radiation.  Plan Continue treatment as planned. The patient will be contacted with a dental appointment with Dr. Drexel Iha.        Sheral Apley Tammi Klippel, M.D.  This document serves as a record of services personally performed by  Tyler Pita, MD. It was created on his behalf by Arlyce Harman, a trained medical scribe. The creation of this record is based on the scribe's personal observations and the provider's statements to them. This document has been checked and approved by the attending provider.

## 2016-09-17 NOTE — Progress Notes (Signed)
Randy [name] presents for his 9th fraction of radiation to his Tongue and head and neck. He reports pain a 5/10 to his mouth. He does have ulcers to the inside of his bottom lip which have been present for several days. He reports pain a 2/10 in his throat when he swallows and when he wakes up in the morning. He reports thick saliva, dry mouth, and taste changes. He states he has a "head cold" with congestion, and a productive cough with clear sputum. He is able to eat anything he can swallow, and tells me he has not changed his food intake since starting radiation. He is not drinking any nutritional supplements at this time. He is drinking about 5-7 sixteen ounce bottles of water daily, maybe more. His neck is red, with a rash since before his surgery present to the right side of his neck. He is using sonafine twice daily.  BP 128/70   Pulse (!) 56   Temp 98.4 F (36.9 C)   Ht 5\' 11"  (1.803 m)   Wt (!) 306 lb 3.2 oz (138.9 kg)   SpO2 99% Comment: room air  BMI 42.71 kg/m    Wt Readings from Last 3 Encounters:  09/17/16 (!) 306 lb 3.2 oz (138.9 kg)  09/10/16 (!) 309 lb 12.8 oz (140.5 kg)  08/24/16 (!) 318 lb 12.8 oz (144.6 kg)

## 2016-09-18 ENCOUNTER — Ambulatory Visit
Admission: RE | Admit: 2016-09-18 | Discharge: 2016-09-18 | Disposition: A | Discharge status: Home / Self Care | Payer: Managed Care, Other (non HMO) | Source: Ambulatory Visit | Attending: Radiation Oncology | Admitting: Radiation Oncology

## 2016-09-18 DIAGNOSIS — Z510 Encounter for antineoplastic radiation therapy: Secondary | ICD-10-CM | POA: Diagnosis not present

## 2016-09-18 NOTE — Progress Notes (Signed)
A user error has taken place: encounter opened in error, closed for administrative reasons.

## 2016-09-21 ENCOUNTER — Ambulatory Visit
Admission: RE | Admit: 2016-09-21 | Discharge: 2016-09-21 | Disposition: A | Discharge status: Home / Self Care | Payer: Managed Care, Other (non HMO) | Source: Ambulatory Visit | Attending: Radiation Oncology | Admitting: Radiation Oncology

## 2016-09-21 ENCOUNTER — Telehealth: Payer: Self-pay | Admitting: *Deleted

## 2016-09-21 DIAGNOSIS — Z510 Encounter for antineoplastic radiation therapy: Secondary | ICD-10-CM | POA: Diagnosis not present

## 2016-09-21 DIAGNOSIS — C020 Malignant neoplasm of dorsal surface of tongue: Secondary | ICD-10-CM

## 2016-09-21 MED ORDER — OXYCODONE HCL 5 MG PO TABS: 5.0000 mg | ORAL_TABLET | ORAL | 0 refills | Status: DC | PRN

## 2016-09-21 MED ORDER — MUGARD MT LIQD: 5.0000 mL | OROMUCOSAL | 2 refills | Status: AC | PRN

## 2016-09-21 MED ORDER — OXYCODONE HCL ER 10 MG PO T12A: 10.0000 mg | EXTENDED_RELEASE_TABLET | Freq: Two times a day (BID) | ORAL | 0 refills | Status: DC

## 2016-09-21 NOTE — Progress Notes (Addendum)
Patient here for pain in mouth throat 8/10 score, has ulcers inside  bottom lip, unable to swallow, took oxycodone this morning and at 1pm  Left over from surgery, cannot chew food, has protein shakes he is drinking,  And drinking water,  Requesting pain medication, ga not gargling with any thing, does take biotene  2:38 PM BP 116/62 (BP Location: Left Arm, Patient Position: Sitting, Cuff Size: Large)   Pulse 60   Temp 98.9 F (37.2 C) (Oral)   Resp 20   Wt (!) 305 lb 9.6 oz (138.6 kg)   SpO2 100%   BMI 42.62 kg/m   Wt Readings from Last 3 Encounters:  09/17/16 (!) 306 lb 3.2 oz (138.9 kg)  09/10/16 (!) 309 lb 12.8 oz (140.5 kg)  08/24/16 (!) 318 lb 12.8 oz (144.6 kg)

## 2016-09-21 NOTE — Telephone Encounter (Signed)
Oncology Nurse Navigator Documentation  Spoke with patient to confirm his attendance at tomorrow morning's [place] attendance following Tomo treatment.  He voiced understanding.  He reported increasing mouth/throat soreness over weekend causing inability to eat.  "I need something for pain."  I indicated he can see Dr. Tammi Klippel or PA Ebony Hail following Tomo tmt, that he should ask RTT to bring him to Nursing.  I notified Merrilee Seashore, RTT [place], and PA [employer].  Gayleen Orem, RN, BSN, [place] Neck Oncology Nurse [place] at [place] [phone]

## 2016-09-21 NOTE — Progress Notes (Signed)
  Radiation Oncology         [phone]   Name: Randy [name] MRN: [id number]   Date: 09/21/2016  DOB: 01/05/1956     Weekly Radiation Therapy Management  No diagnosis found.  Current Dose: 18 Gy  Planned Dose:  60 Gy  Narrative The patient presents for a work in visit with concerns for mucositis.   The patient is here today for pain in mouth and throat, 8/10 in severity. Per nursing, he has ulcers inside his bottom lip. He reports being unable to swallow, and took oxycodone this morning and at 1 pm, which has given only modest relief. He cannot chew food due to the pain, and instead drinks protein shakes and water.   Physical Findings  vitals were not taken for this visit.. Weight loss of 12 lbs since 08/24/16. Alert, in no acute distress. Patient has multiple ulcerations of the oral mucosa including the outer lips of the mouth, and involving the buccal mucosa and posterior oropharynx. No candida like plaques are noted. The appearing is suspicious for mucositis consistent with radiation effects.  Impression Grade III mucositis.  Plan We will continue treatment and the patient will proceed with radiotherapy as outlined, and return for his formal under treatment visit with Dr. Tammi Klippel later this week. I will prescribe Mugard and oxycontin for extended pain relief with continuation of oxycodone IR to help with the patient's pain.        Carola Rhine, PAC

## 2016-09-21 NOTE — Progress Notes (Signed)
Patient here for pain in mouth throat 8/10 score, has ulcers inside  bottom lip, unable to swallow, took oxycodone this morning and at 1pm  Left over from surgery, cannot chew food, has protein shakes he is drinking,  And drinking water,  Requesting pain medication, ga not gargling with any thing, does take biotene  3:02 PM There were no vitals taken for this visit.  Wt Readings from Last 3 Encounters:  09/21/16 (!) 305 lb 9.6 oz (138.6 kg)  09/17/16 (!) 306 lb 3.2 oz (138.9 kg)  09/10/16 (!) 309 lb 12.8 oz (140.5 kg)

## 2016-09-22 ENCOUNTER — Ambulatory Visit: Payer: Managed Care, Other (non HMO) | Attending: Radiation Oncology | Admitting: Physical Therapy

## 2016-09-22 ENCOUNTER — Encounter: Payer: Self-pay | Admitting: *Deleted

## 2016-09-22 ENCOUNTER — Telehealth: Payer: Self-pay | Admitting: Radiation Oncology

## 2016-09-22 ENCOUNTER — Other Ambulatory Visit: Payer: Self-pay | Admitting: Radiation Oncology

## 2016-09-22 ENCOUNTER — Ambulatory Visit: Payer: Managed Care, Other (non HMO)

## 2016-09-22 ENCOUNTER — Ambulatory Visit: Payer: Managed Care, Other (non HMO) | Admitting: Nutrition

## 2016-09-22 ENCOUNTER — Ambulatory Visit
Admission: RE | Admit: 2016-09-22 | Discharge: 2016-09-22 | Disposition: A | Discharge status: Home / Self Care | Payer: Managed Care, Other (non HMO) | Source: Ambulatory Visit | Attending: Radiation Oncology | Admitting: Radiation Oncology

## 2016-09-22 DIAGNOSIS — R29898 Other symptoms and signs involving the musculoskeletal system: Secondary | ICD-10-CM

## 2016-09-22 DIAGNOSIS — M6281 Muscle weakness (generalized): Secondary | ICD-10-CM | POA: Diagnosis present

## 2016-09-22 DIAGNOSIS — E860 Dehydration: Secondary | ICD-10-CM

## 2016-09-22 DIAGNOSIS — R293 Abnormal posture: Secondary | ICD-10-CM | POA: Diagnosis not present

## 2016-09-22 DIAGNOSIS — Z510 Encounter for antineoplastic radiation therapy: Secondary | ICD-10-CM | POA: Diagnosis not present

## 2016-09-22 DIAGNOSIS — R1310 Dysphagia, unspecified: Secondary | ICD-10-CM

## 2016-09-22 NOTE — Progress Notes (Signed)
Patient was seen in head and neck clinic.  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  He is a patient of Dr. Tammi Klippel.  Past medical history includes tobacco, alcohol usage of at least 8 beers a day, chronic kidney disease, diverticulitis, and hypertension.  Medications include Xanax, Lipitor, vitamin D, Celexa, multivitamin.  Labs were reviewed.  Height: 5 feet 11 inches. Weight: 305.2 pounds. Usual body weight: 337 pounds September 2017. BMI: 42.62 (obese)  Estimated nutrition needs: 2600-2800 calories, 130-150 grams protein, 2.8 L fluid.  Patient reports he has received 12 treatments of radiation therapy to his tongue. His mouth is getting very sore. He has been diagnosed with Grade III mucositis. Reports he cannot chew any foods. He is tolerating 2 bottles of boost high-protein along with broth and pudding. Estimated calorie intake between 500 and 600 cal daily. Patient reports he has discontinued all alcohol.  Nutrition diagnosis: Severe malnutrition related to 10% weight loss in 3 months and less than 75% energy intake for greater than one month.  Intervention: Recommended patient change oral nutrition supplements to Ensure Enlive or equivalent providing 350 cal and 20 g protein per bottle. (7 bottles daily will provide 2450 cal and 140 g protein.) Recommended patient continue soft moist foods and liquids as tolerated. Recommended feeding tube placement as patient has several months of treatment and recovery and will  unlikely be able to tolerate adequate nutrition throughout the remaining treatment. Provided education to patient and wife about feeding tube placement.  Patient agrees to consider seriously and will contact the navigator or M.D. after he makes a decision.  Monitoring, evaluation, goals:  Patient will tolerate oral intake plus oral nutrition supplements to minimize weight loss throughout treatment and promote adequate healing. Patient will consider feeding  tube placement.  Next visit: To be scheduled weekly.  **Disclaimer: This note was dictated with voice recognition software. Similar sounding words can inadvertently be transcribed and this note may contain transcription errors which may not have been corrected upon publication of note.**

## 2016-09-22 NOTE — Patient Instructions (Signed)
SWALLOWING EXERCISES Do these 6 of the 7 days per week until 6 months after your last radiation therapy day, then 2 times per week afterwards  1. Effortful Swallows - Press your tongue against the roof of your mouth for 3 seconds, then squeeze          the muscles in your neck while you swallow your saliva or a sip of water - Repeat 20 times, 2-3 times a day, and use whenever you eat or drink  2. Masako Swallow - swallow with your tongue sticking out - Stick tongue out past your teeth and gently bite tongue with your teeth - Swallow, while holding your tongue with your teeth - Repeat 20 times, 2-3 times a day *use a wet spoon if your mouth gets dry*  3. Shaker Exercise - head lift - Lie flat on your back in your bed or on a couch without pillows - Raise your head and look at your feet - KEEP YOUR SHOULDERS DOWN - HOLD FOR 45-60 SECONDS, then lower your head back down - Repeat 3 times, 2-3 times a day  4. Mendelsohn Maneuver - "half swallow" exercise - Start to swallow, and keep your Adam's apple up by squeezing hard with the            muscles of the throat - Hold the squeeze for 5-7 seconds and then relax - Repeat 20 times, 2-3 times a day *use a wet spoon if your mouth gets dry*  5. TBreath Hold - Say "HUH!" loudly, then hold your breath for 3 seconds at your voice box - Repeat 20 times, 2-3 times a day  6. Chin pushback - Open your mouth  - Place your fist UNDER your chin near your neck, and push back with your fist for 5 seconds - Repeat 10 times, 2-3 times a day

## 2016-09-22 NOTE — Progress Notes (Signed)
Head & Neck Multidisciplinary Clinic Clinical Social Work  Clinical Social Work met with patient/family at head & neck multidisciplinary clinic to offer support and assess for psychosocial needs.  Randy [name] was accompanied by his spouse, Randy [name].  The patient shared he is coping well with treatment, his main concern at this time is pain. The patient shared he is already experiencing side effects from his first few weeks of treatment.  His spouse is most concerned about malnutrition.  Patient agreed he needs feeding tube placement- CSW normalized need for feeding tube through this type of treatment.  The patient feels he has a strong support system including his spouse, daughters, and grandchildren.  He also shared he has faith in God, which provides him support and comfort.  Clinical Social Work briefly discussed Clinical Social Work role and [employer] support programs/services.  Clinical Social Work encouraged patient to call with any additional questions or concerns.   Randy [name], MSW, LCSW, OSW-C Clinical Social Worker [facility] [phone]

## 2016-09-22 NOTE — Therapy (Signed)
[facility] [address] [place], [place], [address] Phone: [phone]   Fax:  [phone]  Speech Language Pathology Evaluation  Patient Details  Name: Randy [name] MRN: [id number] Date of Birth: [birth year] Referring Provider: Tyler Pita, MD  Encounter Date: 09/22/2016      End of Session - 09/22/16 1153    Visit Number 1   Number of Visits 7   Date for SLP Re-Evaluation 04/23/17   SLP Start Time [id number]   SLP Stop Time  1115   SLP Time Calculation (min) 40 min   Activity Tolerance Patient tolerated treatment well      Past Medical History:  Diagnosis Date  . Arthritis    left knee  . Cancer ([place])   . Chronic kidney disease   . Diverticulitis   . Hypertension     Past Surgical History:  Procedure Laterality Date  . COLON RESECTION SIGMOID    . colonscopy  11/2014  . KNEE ARTHROSCOPY Left   . left partial glossectomy with primary closure and left neck dissection    . NASAL ENDOSCOPY     x 3    There were no vitals filed for this visit.      Subjective Assessment - 09/22/16 1057    Subjective Pt complains of pain in mouth affecting his labial and lingual ROM. Pt with very mild articulation difference as well, likely due to mucositis.   Patient is accompained by: Family member  wife            SLP Evaluation [facility] - 09/22/16 1057      SLP Visit Information   SLP Received On 09/22/16   Referring Provider Tyler Pita, MD   Medical Diagnosis Dorsal Tongue CA     Subjective   Subjective Pt with thick saliva, reports difficulty with swallowing.     Pain Assessment   Currently in Pain? Yes   Pain Score 9    Pain Location Mouth   Pain Orientation Right;Left;Anterior;Posterior   Pain Type Acute pain   Pain Onset In the past 7 days   Pain Frequency Constant   Pain Relieving Factors meds   Effect of Pain on Daily Activities harder to eat/swallow     Prior Functional Status    Cognitive/Linguistic Baseline Within functional limits     Cognition   Overall Cognitive Status Within Functional Limits for tasks assessed     Auditory Comprehension   Overall Auditory Comprehension Appears within functional limits for tasks assessed     Verbal Expression   Overall Verbal Expression Appears within functional limits for tasks assessed     Oral Motor/Sensory Function   Overall Oral Motor/Sensory Function Impaired   Labial ROM Reduced right;Reduced left  due to mucositis   Labial Strength --  CNT due to mucositis   Lingual ROM Reduced right;Reduced left  due to mucositis   Lingual Symmetry Within Functional Limits   Lingual Strength --  CNT due to mucositis   Lingual Coordination --  CNT due to mucositis     Motor Speech   Overall Motor Speech Impaired   Articulation Impaired  due to mucositis - MILD   Level of Impairment Phrase   Intelligibility Intelligible      Pt currently tolerates applesauce and water on limited basis due to pain from mucositis, as well as two protein drinks/day. PEG likely to be placed soon as pt underwent PEG education today. Obvious mucositis was appreciated upon visual inspection of  pt's oral cavity. Pt also with small open sores and scabs on his lips. POs: Pt ate applesauce without overt s/s aspiration, however consistent throat clear heard with consecutive multiple sips thin. SLP cued pt to swallow single sips instead and pt was then without throat clearing. SLP ensured pt understood re: single sips thin liquids as a compensatory technique to avoid risk of aspiration. Thyroid elevation appeared slightly reduced, and swallows appeared timely. Pt's swallow deemed WFL at this time.   Because data states the risk for dysphagia during and after radiation treatment is high due to undergoing radiation tx, SLP taught pt about the possibility of reduced/limited ability for PO intake during rad tx. SLP encouraged pt to continue swallowing POs  as far into rad tx as possible, even ingesting POs and/or completing HEP shortly after administration of pain meds.   SLP educated pt re: changes to swallowing musculature after rad tx, and why adherence to dysphagia HEP provided today and PO consumption was necessary to inhibit muscular disuse atrophy and to reduce muscle fibrosis following rad tx. Pt demonstrated understanding of these things to SLP.    SLP then developed a HEP for pt and pt was instructed how to perform exercises involving lingual, vocal, and pharyngeal strengthening. SLP performed each exercise and pt return demonstrated each exercise. SLP ensured pt performance was correct prior to moving on to next exercise. Pt was instructed to complete this program 2-3 times a day, 6 of the 7 days/week until 6 months after their last rad tx, then x2 a week after that. Pt was in agreement with this plan.                     SLP Education - 09/22/16 1152    Education provided Yes   Education Details HEP, late effects of head/neck radiation on swallowing ability, compensation of single sips instead of multiple consecutive sips   Person(s) Educated Patient;Spouse   Methods Explanation;Handout;Demonstration;Verbal cues   Comprehension Verbalized understanding;Returned demonstration;Verbal cues required;Need further instruction          SLP Short Term Goals - 09/22/16 [place] #1   Title pt will tell SLP why he is completing HEP   Time 2   Period --  visits (3 total visits)   Status New     SLP SHORT TERM GOAL #2   Title pt will complete HEP with rare min A over two visits   Time 3   Period --  visits (4 total visits)   Status New     SLP SHORT TERM GOAL #3   Title pt will tell SLP 3 overt s/s aspiration PNA   Time 3   Period --  visits (4 total)   Status New          SLP Long Term Goals - 09/22/16 1657      SLP LONG TERM GOAL #1   Title pt will tell SLP how a food journal can A  pt in returning to more normal consistency foods    Time 4   Period --  visits (5 total)   Status New     SLP LONG TERM GOAL #2   Title pt will complete HEP with modified independence over three sessions   Time 6   Period --  visits (7 total)   Status New     SLP LONG TERM GOAL #3   Title pt will tell SLP when  data states is greatest chance for muscle fibrosis to begin (3 - 6 months post rad tx) over two sessions   Time 4   Period --  visits (5 total)          Plan - 09/22/16 1154    Clinical Impression Statement Pt with limited lingual and labial ROM due to mucositis. SLP rec pt take single sips due to throat clear consistent with consecutive multiple sips, and no overt s/s aspiration heard with single sips. No overt s/s aspiration with applesauce. Pt with PEG education today, likely with PEG placement soon.  Pt would benefit from skilled ST due to risk of dysphagia increasing during and after rad tx for assessment of completion of HEP as well as safety of PO diet.   Speech Therapy Frequency --  approx once every four weeks   Duration --  6 visits   Treatment/Interventions Aspiration precaution training;Pharyngeal strengthening exercises;Diet toleration management by SLP;Compensatory techniques;Internal/external aids;SLP instruction and feedback;Patient/family education;Cueing hierarchy;Trials of upgraded texture/liquids   Potential to Achieve Goals Good      Patient will benefit from skilled therapeutic intervention in order to improve the following deficits and impairments:   Dysphagia, unspecified type    Problem List Patient Active Problem List   Diagnosis Date Noted  . Cancer of dorsal tongue ([place]) 08/03/2016  . HYPERGLYCEMIA 01/27/2010  . HYPERLIPIDEMIA 01/04/2009  . GOUT 01/04/2009  . OBESITY, UNSPECIFIED 01/04/2009  . ANXIETY 01/04/2009  . ERECTILE DYSFUNCTION 01/04/2009  . HYPERTENSION 01/04/2009  . ALLERGIC RHINITIS 01/04/2009  . COLONIC POLYPS, HX OF  01/04/2009    [facility] 09/22/2016, 5:06 PM  [facility] [address] [place], [place], [address] Phone: [phone]   Fax:  [phone]  Name: RILEIGH DUDENHOEFFER MRN: [id number] Date of Birth: 07/10/1956

## 2016-09-22 NOTE — Therapy (Signed)
[facility] [place], [place], [address] Phone: [phone]   Fax:  [phone]  Physical Therapy Evaluation  Patient Details  Name: Randy [name] MRN: [id number] Date of Birth: [birth year] Referring Provider: Dr. Tyler Pita  Encounter Date: 09/22/2016      PT End of Session - 09/22/16 1048    Visit Number 1   Number of Visits 1   PT Start Time 0902   PT Stop Time 0930   PT Time Calculation (min) 28 min   Activity Tolerance Patient tolerated treatment well   Behavior During Therapy [facility] for tasks assessed/performed      Past Medical History:  Diagnosis Date  . Arthritis    left knee  . Cancer ([place])   . Chronic kidney disease   . Diverticulitis   . Hypertension     Past Surgical History:  Procedure Laterality Date  . COLON RESECTION SIGMOID    . colonscopy  11/2014  . KNEE ARTHROSCOPY Left   . left partial glossectomy with primary closure and left neck dissection    . NASAL ENDOSCOPY     x 3    There were no vitals filed for this visit.       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  Started XRT 09/07/16   Patient Stated Goals get info from all clinic providers   Currently in Pain? 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 Tyler Pita   Onset Date/Surgical Date 07/06/16  partial glossectomy and neck dissection   Prior Therapy none     Precautions   Precautions Other (comment)   Precaution Comments cancer precautions     Restrictions   Weight Bearing Restrictions No     Balance Screen   Has the patient fallen in the past 6 months No   Has the patient had a decrease in activity level because of a fear of falling?  No   Is the patient reluctant to leave their home because of a fear of falling?  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   Leisure no regular exercise due to his bad knees     Cognition   Overall Cognitive Status Within Functional Limits for tasks assessed     Observation/Other Assessments   Observations obese gentleman who  appears uncomfortable; visible sore on his lips     Functional Tests   Functional tests Sit to Stand     Sit to Stand   Comments 10 times in 30 seconds, below average for age     Posture/Postural Control   Posture/Postural Control Postural limitations   Postural Limitations Forward head;Rounded Shoulders     ROM / Strength   AROM / PROM / Strength AROM     AROM   Overall AROM Comments active neck extension limited 50%, sidebend 25%, others WFL; shoulders [facility]     Ambulation/Gait   Ambulation/Gait Yes   Ambulation/Gait Assistance 7: Independent           LYMPHEDEMA/ONCOLOGY QUESTIONNAIRE - 09/22/16 1044      Type   Cancer Type tongue squamous cell     Surgeries   Other Surgery Date 07/06/16   Number Lymph Nodes Removed 40     Treatment   Active Radiation Treatment Yes   Date  09/07/16     Lymphedema Assessments   Lymphedema Assessments Head and Neck     Head and Neck   4 cm superior to sternal notch around neck 46.8 cm   6 cm superior to sternal notch around neck 47.6 cm   8 cm superior to sternal notch around neck 49 cm                        PT Education - 09/22/16 1046    Education provided Yes   Education Details neck ROM, posture, breathing, exercise recommendation (arm bike or water exercise due to his knees), Cure article on staying active, lymphedema info   Person(s) Educated Patient;Spouse   Methods Explanation;Handout   Comprehension Verbalized understanding                 Head and Neck Clinic Goals - 09/22/16 1100      Patient will be able to verbalize understanding of a home exercise program for cervical range of motion, posture, and walking.    Status Achieved     Patient will be able to verbalize understanding of proper sitting and standing posture.    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  He has forward head posture and limited neck ROM, mouth and throat pain, scored low on sit to stand for 30 seconds, and rarely uses a cane for gait due to knee OA.  Eval is moderate complexity due to comorbidities of neck dissection and ongoing radiation treatment with mucositis and also evolving with ongoing radiation treatment; high risk of lymphedema due to neck dissection and XRT.   Rehab Potential Good   PT Frequency One time visit   PT Treatment/Interventions Patient/family education   PT Next Visit Plan None at this time   PT Home Exercise Plan neck ROM, cardiovascular exercise such as arm bike or water exercise   Consulted and Agree with Plan of Care Patient       Patient will benefit from skilled therapeutic intervention in order to improve the following deficits and impairments:  Postural dysfunction, Decreased range of motion, Decreased knowledge of precautions  Visit Diagnosis: Abnormal posture - Plan: PT plan of care cert/re-cert  Other symptoms and signs involving the musculoskeletal  system - Plan: PT plan of care cert/re-cert  Muscle weakness (generalized) - Plan: PT plan of care cert/re-cert     Problem List Patient Active Problem List   Diagnosis Date Noted  . Cancer of dorsal tongue ([place]) 08/03/2016  . HYPERGLYCEMIA 01/27/2010  . HYPERLIPIDEMIA 01/04/2009  . GOUT 01/04/2009  . OBESITY, UNSPECIFIED 01/04/2009  . ANXIETY 01/04/2009  . ERECTILE DYSFUNCTION 01/04/2009  . HYPERTENSION 01/04/2009  . ALLERGIC RHINITIS 01/04/2009  . COLONIC POLYPS, HX OF 01/04/2009    SALISBUR[place],DONNA 09/22/2016, 11:02 AM  [facility] [place], [place], [address] Phone: [phone]   Fax:  [phone]  Name: Randy [name] MRN: [id number] Date of Birth: 12/24/1955  Serafina Royals, PT 09/22/16 11:03 AM

## 2016-09-22 NOTE — Progress Notes (Signed)
Oncology Nurse Navigator Documentation  Met with Mr. Haydon during [facility] [place].  He was accompanied by his wife.  Arrived him to Nursing, provided verbal and written overview of [place], the clinicians who will be seeing him, encouraged him to ask questions during his time with them.  He was seen by Nutrition, SLP, PT, SW and [place].  Using  PEG teaching device   and Teach Back, provided education for PEG use and care, including: hand hygiene, gravity bolus administration of daily water flushes, nutritional supplement, fluids and medications; care of tube insertion site including daily dressing change and cleaning; S&S of infection.  Mr Rawles correctly verbalized dressing change and cleaning procedures, he and wife provided correct return demonstration of gravity administration of water.  He understands I will be available for ongoing PEG educational support.  Spoke with him at end of [facility], addressed questions. He understands I can be contacted with needs/concerns.  Gayleen Orem, RN, BSN, [place] at [place] [phone]

## 2016-09-22 NOTE — Telephone Encounter (Signed)
Phoned patient requesting after radiation treatment tomorrow he stop in the lab to have his blood work drawn. Patient confirms he will. Patient understands this RN will call with results.

## 2016-09-23 ENCOUNTER — Ambulatory Visit
Admission: RE | Admit: 2016-09-23 | Discharge: 2016-09-23 | Disposition: A | Discharge status: Home / Self Care | Payer: Managed Care, Other (non HMO) | Source: Ambulatory Visit | Attending: Radiation Oncology | Admitting: Radiation Oncology

## 2016-09-23 DIAGNOSIS — E860 Dehydration: Secondary | ICD-10-CM

## 2016-09-23 DIAGNOSIS — Z510 Encounter for antineoplastic radiation therapy: Secondary | ICD-10-CM | POA: Diagnosis not present

## 2016-09-23 LAB — BASIC METABOLIC PANEL
Anion Gap: 9 mEq/L (ref 3–11)
BUN: 36.1 mg/dL — ABNORMAL HIGH (ref 7.0–26.0)
CHLORIDE: 98 meq/L (ref 98–109)
CO2: 24 meq/L (ref 22–29)
Calcium: 9.5 mg/dL (ref 8.4–10.4)
Creatinine: 1.9 mg/dL — ABNORMAL HIGH (ref 0.7–1.3)
EGFR: 38 mL/min/{1.73_m2} — AB (ref >90)
Glucose: 90 mg/dl (ref 70–140)
Potassium: 4.4 mEq/L (ref 3.5–5.1)
SODIUM: 131 meq/L — AB (ref 136–145)

## 2016-09-24 ENCOUNTER — Telehealth: Payer: Self-pay | Admitting: Radiation Oncology

## 2016-09-24 ENCOUNTER — Encounter (INDEPENDENT_AMBULATORY_CARE_PROVIDER_SITE_OTHER): Payer: Self-pay

## 2016-09-24 ENCOUNTER — Other Ambulatory Visit: Payer: Self-pay | Admitting: Radiation Oncology

## 2016-09-24 ENCOUNTER — Other Ambulatory Visit: Payer: Self-pay | Admitting: Nurse Practitioner

## 2016-09-24 ENCOUNTER — Ambulatory Visit (HOSPITAL_COMMUNITY): Payer: Self-pay | Admitting: Dentistry

## 2016-09-24 ENCOUNTER — Telehealth: Payer: Self-pay | Admitting: *Deleted

## 2016-09-24 ENCOUNTER — Ambulatory Visit
Admission: RE | Admit: 2016-09-24 | Discharge: 2016-09-24 | Disposition: A | Discharge status: Home / Self Care | Payer: Managed Care, Other (non HMO) | Source: Ambulatory Visit | Attending: Radiation Oncology | Admitting: Radiation Oncology

## 2016-09-24 ENCOUNTER — Encounter (HOSPITAL_COMMUNITY): Payer: Self-pay | Admitting: Dentistry

## 2016-09-24 VITALS — BP 105/41 | HR 65 | Temp 99.2°F

## 2016-09-24 DIAGNOSIS — R432 Parageusia: Secondary | ICD-10-CM

## 2016-09-24 DIAGNOSIS — K117 Disturbances of salivary secretion: Secondary | ICD-10-CM

## 2016-09-24 DIAGNOSIS — K1230 Oral mucositis (ulcerative), unspecified: Secondary | ICD-10-CM

## 2016-09-24 DIAGNOSIS — M264 Malocclusion, unspecified: Secondary | ICD-10-CM

## 2016-09-24 DIAGNOSIS — C020 Malignant neoplasm of dorsal surface of tongue: Secondary | ICD-10-CM

## 2016-09-24 DIAGNOSIS — R1310 Dysphagia, unspecified: Secondary | ICD-10-CM

## 2016-09-24 DIAGNOSIS — K0530 Chronic periodontitis, unspecified: Secondary | ICD-10-CM

## 2016-09-24 DIAGNOSIS — E860 Dehydration: Secondary | ICD-10-CM

## 2016-09-24 DIAGNOSIS — R634 Abnormal weight loss: Secondary | ICD-10-CM

## 2016-09-24 DIAGNOSIS — K08409 Partial loss of teeth, unspecified cause, unspecified class: Secondary | ICD-10-CM

## 2016-09-24 DIAGNOSIS — K036 Deposits [accretions] on teeth: Secondary | ICD-10-CM

## 2016-09-24 DIAGNOSIS — C021 Malignant neoplasm of border of tongue: Secondary | ICD-10-CM

## 2016-09-24 DIAGNOSIS — R682 Dry mouth, unspecified: Secondary | ICD-10-CM

## 2016-09-24 DIAGNOSIS — K1233 Oral mucositis (ulcerative) due to radiation: Secondary | ICD-10-CM

## 2016-09-24 DIAGNOSIS — Z510 Encounter for antineoplastic radiation therapy: Secondary | ICD-10-CM | POA: Diagnosis not present

## 2016-09-24 NOTE — Patient Instructions (Signed)

## 2016-09-24 NOTE — Telephone Encounter (Addendum)
Phoned patient to encourage him to increase fluid intake to bring elevated creatinine levels down. Patient reports he is unable to eat or drink by mouth. Reports it has become increasingly difficulty to "even swallow his pain medication." Explained to patient I would inform Dr. Tammi Klippel of these findings, make further arrangements based on his recommendations and my/Rick Gerald Dexter will be in touch. Patient verbalized understanding.   Per Dr. Johny Shears placed a verbal order for PEG tube placement. Reached out to [employer] of symptom management for assistance. Jenny Reichmann committed to arranging hydration this Friday and next Monday, Wednesday and Friday. Gayleen Orem committed to phoning patient with new plan.

## 2016-09-24 NOTE — Telephone Encounter (Signed)
-----   Message from Tyler Pita, MD sent at 09/24/2016  9:34 AM EST ----- Cr increased.  Encourage increased PO fluids versus IV a few times per week

## 2016-09-24 NOTE — Telephone Encounter (Signed)
Oncology Nurse Navigator Documentation  Spoke with Mr. Rapkin:  Confirmed his understanding of 12/21 appt [facility] with 0930 arrival, NPO status.  He understands I will pick up barium contrast for him.    I informed him IVF appts are being scheduled for tomorrow, M, W, and F of next week in [place]. He understands I will provide further guidance tomorrow when he arrives for XRT and WUT with Dr. Tammi Klippel. He understands to call me with questions/concerns.  Gayleen Orem, RN, BSN, [place] [facility] Oncology Nurse [place] at [place] [phone]

## 2016-09-24 NOTE — Telephone Encounter (Signed)
Opened in error. See other telephone encounter.

## 2016-09-24 NOTE — Progress Notes (Signed)
Limited oral examination-problem focused  Date of examination:  09/24/2016 Patient Name:   Randy [name] Date of Birth:   July 13, 1956 Medical Record Number: [id number]  VITALS: BP (!) 105/41 (BP Location: Left Arm)   Pulse 65   Temp 99.2 F (37.3 C) (Oral)   CHIEF COMPLAINT: Patient referred by Dr. Tammi Klippel for dental examination.   HPI: Randy [name] is a 60 year old male diagnosed with left lateral tongue cancer after a biopsy by his oral surgeon, Dr. Katheren Shams. Patient was then referred to Dr. Corliss Skains in [place], [place] for surgical evalaution. Patient is status post left partial glossectomy and neck dissection by Dr. Corliss Skains on 07/06/16.  Patient then referred to Dr. Tammi Klippel for postoperative radiation therapy. Patient currently undergoing active radiation therapy with no chemotherapy. Patient now presents for evaluation of severe radiation-induced mucositis with questionable need to fabricate scatter protection devices at this time.  The patient currently denies acute toothaches, swellings, or abscesses. Patient is complaining of severe mouth pain that is "horrible". The patient relates having a pain intensity of  "25 out of 10" in intensity. Pain is relieved somewhat with the current oxycodone pain regimen recently prescribed. Patient also indicates that the recent prescription for Mugard seems to help as well. Patient was last seen by his primary dentist, Dr. Massie Maroon for an exam and cleaning approximately one week prior to start of radiation therapy. No extractions were performed at that time. Patient was given a prescription for PreviDent 5000 Plus for use at bedtime. No fluoride trays were fabricated at that time.  Patient denies having any dental phobia. Patient is usually seen on a 3 times a year basis.  Patient denies having any previous treatment by a Periodontist.  Patient does have a history of some trauma to tooth #8 when he was 72 or 60 years of age that resulted  in the placement of a crown on tooth #8.  PROBLEM LIST: Patient Active Problem List   Diagnosis Date Noted  . 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 Cancer of dorsal tongue ([place]) 08/03/2016  . HYPERGLYCEMIA 01/27/2010  . HYPERLIPIDEMIA 01/04/2009  . GOUT 01/04/2009  . OBESITY, UNSPECIFIED 01/04/2009  . ANXIETY 01/04/2009  . ERECTILE DYSFUNCTION 01/04/2009  . HYPERTENSION 01/04/2009  . ALLERGIC RHINITIS 01/04/2009  . COLONIC POLYPS, HX OF 01/04/2009    PMH: Past Medical History:  Diagnosis Date  . Arthritis    left knee  . Cancer ([place])   . Chronic kidney disease   . Diverticulitis   . Hypertension     PSH: Past Surgical History:  Procedure Laterality Date  . COLON RESECTION SIGMOID    . colonscopy  11/2014  . KNEE ARTHROSCOPY Left   . left partial glossectomy with primary closure and left neck dissection    . NASAL ENDOSCOPY     x 3    ALLERGIES: No Known Allergies  MEDICATIONS: Current Outpatient Prescriptions  Medication Sig Dispense Refill  . allopurinol (ZYLOPRIM) 300 MG tablet Take 300 mg by mouth daily.    Marland Kitchen ALPRAZolam (XANAX) 0.5 MG tablet     . atorvastatin (LIPITOR) 10 MG tablet Take 40 mg by mouth daily.     . chlorthalidone (HYGROTON) 25 MG tablet Take 25 mg by mouth daily.    . Cholecalciferol (VITAMIN D PO) Take 50,000 Units by mouth once a week.     . citalopram (CELEXA) 20 MG tablet Take 20 mg by mouth daily.    . fenofibrate 160 MG tablet  Take 160 mg by mouth daily.    Marland Kitchen lisinopril (PRINIVIL,ZESTRIL) 40 MG tablet Take 40 mg by mouth daily.    . Multiple Vitamins-Minerals (MULTIVITAMIN ADULT PO) Take by mouth.    . Oral Wound Care Products [facility]) LIQD Use as directed 5 mLs in the mouth or throat every 4 (four) hours as needed. 240 mL 2  . oxyCODONE (OXY IR/ROXICODONE) 5 MG immediate release tablet Take 1 tablet (5 mg total) by mouth every 4 (four) hours as needed for severe pain. 30 tablet 0  . oxyCODONE (OXYCONTIN) 10 mg 12 hr tablet  Take 1 tablet (10 mg total) by mouth every 12 (twelve) hours. 60 tablet 0  . Wound Dressings (SONAFINE EX) Apply topically.     No current facility-administered medications for this visit.     LABS: Lab Results  Component Value Date   WBC 5.2 02/05/2009   HGB 13.8 02/05/2009   HCT 39.3 02/05/2009   MCV 94.5 02/05/2009   PLT 224.0 02/05/2009      Component Value Date/Time   NA 131 (L) 09/23/2016 0858   K 4.4 09/23/2016 0858   CL 105 11/10/2010 2034   CO2 24 09/23/2016 0858   GLUCOSE 90 09/23/2016 0858   BUN 36.1 (H) 09/23/2016 0858   CREATININE 1.9 (H) 09/23/2016 0858   CALCIUM 9.5 09/23/2016 0858   GFRNONAA 82.85 02/05/2009 0913   No results found for: INR, PROTIME No results found for: PTT  SOCIAL HISTORY: Social History   Social History  . Marital status: Married    Spouse name: N/A  . Number of children: 2  . Years of education: N/A   Occupational History  . Not on file.   Social History Main Topics  . Smoking status: Former Smoker    Packs/day: 2.00    Years: 20.00    Types: Cigarettes    Quit date: 10/13/1995  . Smokeless tobacco: Former Systems developer    Quit date: 1972  . Alcohol use 24.0 oz/week    40 Cans of beer per week  . Drug use: No  . Sexual activity: Not Currently   Other Topics Concern  . Not on file   Social History Narrative   Patient is married with 2 step daughters.   Patient with history of smoking 2 packs per day for 20 years. Patient quit in 1997.   The previous use of smokeless tobacco at the age of 65 but quit at that time.   Patient with previous history of extensive beer drinking. But none recently since his cancer diagnosis.    Patient denies any other illicit drug use.    Patient is a Administrator.           FAMILY HISTORY: Family History  Problem Relation Age of Onset  . Cancer Mother     rectal  . Cancer Maternal Grandmother     unknown  . Cancer Paternal Grandmother     unknown  . Cancer Paternal Grandfather      unknown    REVIEW OF SYSTEMS: Reviewed with patient as per history of present illness.  Psych: Patient denies having dental phobia.   DENTAL HISTORY: CHIEF COMPLAINT: Patient referred by Dr. Tammi Klippel for dental examination.   HPI: Randy [name] is a 60 year old male diagnosed with left lateral tongue cancer after a biopsy by his oral surgeon, Dr. Katheren Shams. Patient was then referred to Dr. Corliss Skains in [place], [place] for surgical evalaution. Patient is status post left partial glossectomy and  neck dissection by Dr. Corliss Skains on 07/06/16.  Patient then referred to Dr. Tammi Klippel for postoperative radiation therapy. Patient currently undergoing active radiation therapy with no chemotherapy. Patient now presents for evaluation of severe radiation-induced mucositis with questionable need to fabricate scatter protection devices at this time.  The patient currently denies acute toothaches, swellings, or abscesses. Patient is complaining of severe mouth pain that is "horrible". The patient relates having a pain intensity of  25 out of 10 in intensity. Pain is relieved somewhat with the current oxycodone pain regimen recently prescribed. Patient also indicates that the recent prescription for Mugard seems to help as well. Patient was last seen by his primary dentist, Dr. Massie Maroon for an exam and cleaning approximately one week prior to start of radiation therapy. No extractions were performed at that time. Patient was given a prescription for PreviDent 5000 Plus for use at bedtime. No fluoride trays were fabricated at that time.  Patient denies having any dental phobia. Patient is usually seen on a 3 times a year basis.  Patient denies having any previous treatment by a Periodontist.  Patient does have a history of some trauma to tooth #8 when he was 32 or 60 years of age that resulted in the placement of a crown on tooth #8.  DENTAL EXAMINATION: GENERAL: Patient is a well-developed, well-nourished  male in no acute distress. HEAD AND NECK: The left neck is consistent with previous neck dissection. No right neck lymphadenopathy is palpated. Patient has maximum interincisal opening of approximately 30 mm.  The opening is limited by severe oral pain from "mucositis" by patient report. INTRAORAL EXAM: Lips are dry, chapped, and some bleeding is noted. Patient is currently applying Aquaphor to his lips as directed by radiation oncology. Patient has severe, generalized oral mucositis involving the back of his throat, left and right buccal mucosa, and left and right tongue areas, and inner aspects of upper and lower lips. DENTITION: The patient is missing tooth numbers 1, 3, 16, 18, 19, 31, and 32. PERIODONTAL: The patient has chronic periodontitis with plaque accumulations, selective areas of gingival recession, and incipient to moderate bone loss. Patient would not allow more thorough periodontal examination today secondary to pain and discomfort from the  mucositis. DENTAL CARIES/SUBOPTIMAL RESTORATIONS: Multiple occlusal amalgam restorations on tooth numbers 14, 17, and 30. No obvious dental caries are noted. ENDODONTIC: Patient currently denies acute pulpitis symptoms. I do not see any evidence of periapical pathology.  CROWN AND BRIDGE: The patient has a PFM crown on tooth #8 PROSTHODONTIC: Patient denies having any partial dentures. OCCLUSION: Patient has a poor occlusal scheme secondary to multiple missing teeth, supra-eruption and drifting of the unopposed teeth into the edentulous areas, multiple diastemas, and lack of replacement of missing teeth with dental prostheses.  RADIOGRAPHIC INTERPRETATION: An orthopantogram was taken today. There are multiple missing teeth. There are multiple diastemas noted. There is supra-eruption and drifting of the unopposed teeth into the edentulous areas. There is incipient to moderate bone loss noted. No obvious periapical radiolucencies are noted. Amalgam  restorations are noted on tooth numbers 14 and 17 and 30. There is a crown on tooth #8. No previous root canal therapies are noted.   ASSESSMENTS: 1. Squamous cell carcinoma left lateral tongue  2. Status post left partial glossectomy with neck dissection 3. Active radiation therapy 4. Severe mucositis secondary to radiation therapy 5. Dysgeusia 6. Dysphagia 7. Odynophagia 8. Weight loss 9. Chronic periodontitis with bone loss 10. Accretions 11. Multiple missing teeth  12. Supra-eruption and drifting of the unopposed teeth into the edentulous areas  13. Multiple diastemas 14. Poor occlusal scheme but a stable occlusion at this time.  PLAN/RECOMMENDATIONS: 1. I discussed the risks, benefits, and complications of various treatment options with the patient in relationship to his medical and dental conditions, current radiation therapy, and radiation therapy side effects to include xerostomia, radiation caries, trismus, mucositis, taste changes, gum and jawbone changes, and risk for infection and osteoradionecrosis. We discussed various treatment options to include no treatment, multiple extractions with alveoloplasty, pre-prosthetic surgery as indicated, periodontal therapy, dental restorations, root canal therapy, crown and bridge therapy, implant therapy, and replacement of missing teeth as indicated. We also discussed fabrication of fluoride trays and scatter protection devices. Due to the minimum amount of amalgam and other metal restorations and due to the extent of oral mucositis at this time, the patient agreed that scatter protection devices and fluoride trays would not be fabricated at this time.  Patient did have a trismus device fabricated at this time and was instructed on the use of these exercises to help prevent trismus in the future.  Patient was instructed on the brushing of his teeth on a twice a day basis with use of the PreviDent 5000 Plus at bedtime. Two ultra soft toothbrushes  were provided to the patient today for use in brushing his teeth due to the overall sensitivity from the oral mucositis. Patient is aware that no teeth should be extracted and no periodontal surgery should be performed without further discussion with the radiation oncologist to discuss the potential risk for osteoradionecrosis.  Use of preoperative hyperbaric oxygen therapy was also discussed prior to future dental extractions if needed after the radiation therapy has been completed.  The patient was made aware of the need for follow-up care by his primary dentist on a 3-4 times a year basis starting approximately April 2018. Patient expresses understanding and all questions were answered concerning the above information and discussions.  2. Discussion of findings with medical team and coordination of future medical and dental care as needed.  I spent in excess of  120 minutes during the conduct of this consultation and >50% of this time involved direct face-to-face encounter for counseling and/or coordination of the patient's care.    Lenn Cal, DDS

## 2016-09-25 ENCOUNTER — Encounter: Payer: Self-pay | Admitting: *Deleted

## 2016-09-25 ENCOUNTER — Inpatient Hospital Stay (HOSPITAL_COMMUNITY)
Admission: AD | Admit: 2016-09-25 | Discharge: 2016-10-02 | DRG: 981 | Disposition: A | Discharge status: Home / Self Care | Payer: Managed Care, Other (non HMO) | Source: Ambulatory Visit | Attending: Internal Medicine | Admitting: Internal Medicine

## 2016-09-25 ENCOUNTER — Encounter (HOSPITAL_COMMUNITY): Payer: Self-pay | Admitting: *Deleted

## 2016-09-25 ENCOUNTER — Other Ambulatory Visit (HOSPITAL_BASED_OUTPATIENT_CLINIC_OR_DEPARTMENT_OTHER): Payer: Managed Care, Other (non HMO)

## 2016-09-25 ENCOUNTER — Ambulatory Visit
Admission: RE | Admit: 2016-09-25 | Discharge: 2016-09-25 | Disposition: A | Discharge status: Home / Self Care | Payer: Managed Care, Other (non HMO) | Source: Ambulatory Visit | Attending: Radiation Oncology | Admitting: Radiation Oncology

## 2016-09-25 ENCOUNTER — Ambulatory Visit (HOSPITAL_BASED_OUTPATIENT_CLINIC_OR_DEPARTMENT_OTHER): Payer: Managed Care, Other (non HMO) | Admitting: Nurse Practitioner

## 2016-09-25 ENCOUNTER — Ambulatory Visit: Payer: Managed Care, Other (non HMO)

## 2016-09-25 ENCOUNTER — Encounter: Payer: Self-pay | Admitting: Radiation Oncology

## 2016-09-25 ENCOUNTER — Encounter: Payer: Self-pay | Admitting: Nurse Practitioner

## 2016-09-25 VITALS — BP 123/50 | HR 78 | Temp 99.1°F | Resp 18 | Ht 71.0 in | Wt 300.8 lb

## 2016-09-25 VITALS — BP 113/44 | HR 72 | Temp 99.7°F | Resp 18 | Ht 71.0 in | Wt 301.6 lb

## 2016-09-25 DIAGNOSIS — Z9049 Acquired absence of other specified parts of digestive tract: Secondary | ICD-10-CM

## 2016-09-25 DIAGNOSIS — E860 Dehydration: Secondary | ICD-10-CM

## 2016-09-25 DIAGNOSIS — Y842 Radiological procedure and radiotherapy as the cause of abnormal reaction of the patient, or of later complication, without mention of misadventure at the time of the procedure: Secondary | ICD-10-CM | POA: Diagnosis present

## 2016-09-25 DIAGNOSIS — K5900 Constipation, unspecified: Secondary | ICD-10-CM | POA: Diagnosis present

## 2016-09-25 DIAGNOSIS — N189 Chronic kidney disease, unspecified: Secondary | ICD-10-CM

## 2016-09-25 DIAGNOSIS — N183 Chronic kidney disease, stage 3 unspecified: Secondary | ICD-10-CM | POA: Diagnosis present

## 2016-09-25 DIAGNOSIS — Z809 Family history of malignant neoplasm, unspecified: Secondary | ICD-10-CM

## 2016-09-25 DIAGNOSIS — D649 Anemia, unspecified: Secondary | ICD-10-CM | POA: Diagnosis present

## 2016-09-25 DIAGNOSIS — G893 Neoplasm related pain (acute) (chronic): Secondary | ICD-10-CM | POA: Diagnosis present

## 2016-09-25 DIAGNOSIS — K1230 Oral mucositis (ulcerative), unspecified: Secondary | ICD-10-CM | POA: Diagnosis present

## 2016-09-25 DIAGNOSIS — C020 Malignant neoplasm of dorsal surface of tongue: Secondary | ICD-10-CM

## 2016-09-25 DIAGNOSIS — Z6841 Body Mass Index (BMI) 40.0 and over, adult: Secondary | ICD-10-CM | POA: Diagnosis not present

## 2016-09-25 DIAGNOSIS — F419 Anxiety disorder, unspecified: Secondary | ICD-10-CM | POA: Diagnosis present

## 2016-09-25 DIAGNOSIS — E785 Hyperlipidemia, unspecified: Secondary | ICD-10-CM | POA: Diagnosis present

## 2016-09-25 DIAGNOSIS — C029 Malignant neoplasm of tongue, unspecified: Secondary | ICD-10-CM | POA: Diagnosis present

## 2016-09-25 DIAGNOSIS — R52 Pain, unspecified: Secondary | ICD-10-CM | POA: Diagnosis present

## 2016-09-25 DIAGNOSIS — Z87891 Personal history of nicotine dependence: Secondary | ICD-10-CM | POA: Diagnosis not present

## 2016-09-25 DIAGNOSIS — Z79891 Long term (current) use of opiate analgesic: Secondary | ICD-10-CM

## 2016-09-25 DIAGNOSIS — R509 Fever, unspecified: Secondary | ICD-10-CM

## 2016-09-25 DIAGNOSIS — E43 Unspecified severe protein-calorie malnutrition: Secondary | ICD-10-CM | POA: Diagnosis present

## 2016-09-25 DIAGNOSIS — M109 Gout, unspecified: Secondary | ICD-10-CM | POA: Diagnosis present

## 2016-09-25 DIAGNOSIS — Z808 Family history of malignant neoplasm of other organs or systems: Secondary | ICD-10-CM

## 2016-09-25 DIAGNOSIS — I129 Hypertensive chronic kidney disease with stage 1 through stage 4 chronic kidney disease, or unspecified chronic kidney disease: Secondary | ICD-10-CM | POA: Diagnosis present

## 2016-09-25 DIAGNOSIS — I10 Essential (primary) hypertension: Secondary | ICD-10-CM | POA: Diagnosis present

## 2016-09-25 DIAGNOSIS — E876 Hypokalemia: Secondary | ICD-10-CM | POA: Diagnosis not present

## 2016-09-25 DIAGNOSIS — K1233 Oral mucositis (ulcerative) due to radiation: Principal | ICD-10-CM | POA: Diagnosis present

## 2016-09-25 DIAGNOSIS — N179 Acute kidney failure, unspecified: Secondary | ICD-10-CM | POA: Diagnosis present

## 2016-09-25 DIAGNOSIS — Z800 Family history of malignant neoplasm of digestive organs: Secondary | ICD-10-CM

## 2016-09-25 DIAGNOSIS — R1312 Dysphagia, oropharyngeal phase: Secondary | ICD-10-CM | POA: Diagnosis present

## 2016-09-25 DIAGNOSIS — E669 Obesity, unspecified: Secondary | ICD-10-CM | POA: Diagnosis present

## 2016-09-25 DIAGNOSIS — N170 Acute kidney failure with tubular necrosis: Secondary | ICD-10-CM | POA: Diagnosis not present

## 2016-09-25 DIAGNOSIS — R1310 Dysphagia, unspecified: Secondary | ICD-10-CM

## 2016-09-25 LAB — URINALYSIS, MICROSCOPIC - CHCC
BILIRUBIN (URINE): NEGATIVE
Blood: NEGATIVE
GLUCOSE UR CHCC: NEGATIVE mg/dL
Ketones: NEGATIVE mg/dL
LEUKOCYTE ESTERASE: NEGATIVE
NITRITE: NEGATIVE
Protein: NEGATIVE mg/dL
Specific Gravity, Urine: 1.01 (ref 1.003–1.035)
Urobilinogen, UR: 0.2 mg/dL (ref 0.2–1)
pH: 5 (ref 4.6–8.0)

## 2016-09-25 LAB — COMPREHENSIVE METABOLIC PANEL
ALBUMIN: 3.4 g/dL — AB (ref 3.5–5.0)
ALK PHOS: 36 U/L — AB (ref 40–150)
ALT: 10 U/L (ref 0–55)
ANION GAP: 12 meq/L — AB (ref 3–11)
AST: 13 U/L (ref 5–34)
BILIRUBIN TOTAL: 0.73 mg/dL (ref 0.20–1.20)
BUN: 65.1 mg/dL — ABNORMAL HIGH (ref 7.0–26.0)
CO2: 22 meq/L (ref 22–29)
Calcium: 9.8 mg/dL (ref 8.4–10.4)
Chloride: 98 mEq/L (ref 98–109)
Creatinine: 2.8 mg/dL — ABNORMAL HIGH (ref 0.7–1.3)
EGFR: 23 mL/min/{1.73_m2} — AB (ref >90)
Glucose: 102 mg/dl (ref 70–140)
POTASSIUM: 4.7 meq/L (ref 3.5–5.1)
SODIUM: 132 meq/L — AB (ref 136–145)
TOTAL PROTEIN: 7.1 g/dL (ref 6.4–8.3)

## 2016-09-25 LAB — CBC WITH DIFFERENTIAL/PLATELET
BASO%: 0.2 % (ref 0.0–2.0)
BASOS ABS: 0 10*3/uL (ref 0.0–0.1)
EOS ABS: 0.1 10*3/uL (ref 0.0–0.5)
EOS%: 1.3 % (ref 0.0–7.0)
HCT: 27.8 % — ABNORMAL LOW (ref 38.4–49.9)
HGB: 9.3 g/dL — ABNORMAL LOW (ref 13.0–17.1)
LYMPH%: 10.3 % — AB (ref 14.0–49.0)
MCH: 29.6 pg (ref 27.2–33.4)
MCHC: 33.5 g/dL (ref 32.0–36.0)
MCV: 88.5 fL (ref 79.3–98.0)
MONO#: 1.1 10*3/uL — AB (ref 0.1–0.9)
MONO%: 23.6 % — AB (ref 0.0–14.0)
NEUT%: 64.6 % (ref 39.0–75.0)
NEUTROS ABS: 3.1 10*3/uL (ref 1.5–6.5)
PLATELETS: 184 10*3/uL (ref 140–400)
RBC: 3.14 10*6/uL — AB (ref 4.20–5.82)
RDW: 13.6 % (ref 11.0–14.6)
WBC: 4.8 10*3/uL (ref 4.0–10.3)
lymph#: 0.5 10*3/uL — ABNORMAL LOW (ref 0.9–3.3)

## 2016-09-25 LAB — MAGNESIUM: Magnesium: 2.2 mg/dl (ref 1.5–2.5)

## 2016-09-25 MED ORDER — OXYCODONE HCL ER 10 MG PO T12A
10.0000 mg | EXTENDED_RELEASE_TABLET | Freq: Two times a day (BID) | ORAL | Status: DC
  Administered 2016-09-25 – 2016-09-27 (×5): 10 mg via ORAL
  Filled 2016-09-25 (×5): qty 1

## 2016-09-25 MED ORDER — ALPRAZOLAM 0.5 MG PO TABS
0.5000 mg | ORAL_TABLET | Freq: Every day | ORAL | Status: DC
  Administered 2016-09-25 – 2016-10-02 (×6): 0.5 mg via ORAL
  Filled 2016-09-25 (×6): qty 1

## 2016-09-25 MED ORDER — OXYCODONE HCL 5 MG PO TABS
5.0000 mg | ORAL_TABLET | ORAL | Status: DC | PRN
  Administered 2016-09-25 – 2016-09-28 (×9): 5 mg via ORAL
  Filled 2016-09-25 (×10): qty 1

## 2016-09-25 MED ORDER — SODIUM CHLORIDE 0.9 % IV SOLN
3.0000 g | Freq: Three times a day (TID) | INTRAVENOUS | Status: DC
  Administered 2016-09-25 – 2016-09-26 (×2): 3 g via INTRAVENOUS
  Filled 2016-09-25 (×2): qty 3

## 2016-09-25 MED ORDER — ALLOPURINOL 300 MG PO TABS
300.0000 mg | ORAL_TABLET | Freq: Every day | ORAL | Status: DC
  Administered 2016-09-26 – 2016-10-02 (×5): 300 mg via ORAL
  Filled 2016-09-25 (×5): qty 1

## 2016-09-25 MED ORDER — ACETAMINOPHEN 650 MG RE SUPP: 650.0000 mg | Freq: Four times a day (QID) | RECTAL | Status: DC | PRN

## 2016-09-25 MED ORDER — MAGIC MOUTHWASH W/LIDOCAINE
5.0000 mL | Freq: Three times a day (TID) | ORAL | Status: DC | PRN
  Administered 2016-09-25 – 2016-10-01 (×3): 5 mL via ORAL
  Filled 2016-09-25 (×5): qty 5

## 2016-09-25 MED ORDER — OXYCODONE HCL ER 20 MG PO T12A: 20.0000 mg | EXTENDED_RELEASE_TABLET | Freq: Two times a day (BID) | ORAL | 0 refills | Status: DC

## 2016-09-25 MED ORDER — MORPHINE SULFATE (PF) 4 MG/ML IV SOLN
4.0000 mg | INTRAVENOUS | Status: DC | PRN
  Administered 2016-09-25 – 2016-09-28 (×11): 4 mg via INTRAVENOUS
  Filled 2016-09-25 (×12): qty 1

## 2016-09-25 MED ORDER — ATORVASTATIN CALCIUM 40 MG PO TABS
40.0000 mg | ORAL_TABLET | Freq: Every day | ORAL | Status: DC
  Administered 2016-09-26 – 2016-10-02 (×5): 40 mg via ORAL
  Filled 2016-09-25 (×5): qty 1

## 2016-09-25 MED ORDER — POLYETHYLENE GLYCOL 3350 17 G PO PACK
17.0000 g | PACK | Freq: Every day | ORAL | Status: DC
  Administered 2016-09-26 – 2016-10-01 (×3): 17 g via ORAL
  Filled 2016-09-25 (×4): qty 1

## 2016-09-25 MED ORDER — CITALOPRAM HYDROBROMIDE 20 MG PO TABS
20.0000 mg | ORAL_TABLET | Freq: Every day | ORAL | Status: DC
  Administered 2016-09-26 – 2016-10-02 (×5): 20 mg via ORAL
  Filled 2016-09-25 (×5): qty 1

## 2016-09-25 MED ORDER — SENNOSIDES-DOCUSATE SODIUM 8.6-50 MG PO TABS
1.0000 | ORAL_TABLET | Freq: Two times a day (BID) | ORAL | Status: DC
  Administered 2016-09-25 – 2016-09-30 (×6): 1 via ORAL
  Filled 2016-09-25 (×6): qty 1

## 2016-09-25 MED ORDER — BISACODYL 10 MG RE SUPP
10.0000 mg | Freq: Every day | RECTAL | Status: DC | PRN
Start: 2016-09-25 — End: 2016-09-26

## 2016-09-25 MED ORDER — VITAMIN D (ERGOCALCIFEROL) 1.25 MG (50000 UNIT) PO CAPS
50000.0000 [IU] | ORAL_CAPSULE | ORAL | Status: DC
  Filled 2016-09-25: qty 1

## 2016-09-25 MED ORDER — MUGARD MT LIQD: 5.0000 mL | OROMUCOSAL | Status: DC | PRN

## 2016-09-25 MED ORDER — LIDOCAINE VISCOUS 2 % MT SOLN
15.0000 mL | Freq: Four times a day (QID) | OROMUCOSAL | Status: DC | PRN
  Administered 2016-09-25 – 2016-09-29 (×3): 15 mL via OROMUCOSAL
  Filled 2016-09-25 (×5): qty 15

## 2016-09-25 MED ORDER — LIDOCAINE VISCOUS 2 % MT SOLN: OROMUCOSAL | 3 refills | Status: AC

## 2016-09-25 MED ORDER — SODIUM CHLORIDE 0.9 % IV SOLN
INTRAVENOUS | Status: DC
  Administered 2016-09-25: 11:00:00 via INTRAVENOUS

## 2016-09-25 MED ORDER — ENOXAPARIN SODIUM 30 MG/0.3ML ~~LOC~~ SOLN
30.0000 mg | SUBCUTANEOUS | Status: DC
  Administered 2016-09-25: 30 mg via SUBCUTANEOUS
  Filled 2016-09-25: qty 0.3

## 2016-09-25 MED ORDER — DEXTROSE-NACL 5-0.9 % IV SOLN
INTRAVENOUS | Status: DC
  Administered 2016-09-25 – 2016-10-01 (×11): via INTRAVENOUS
  Administered 2016-10-02: 1000 mL via INTRAVENOUS

## 2016-09-25 MED ORDER — ACETAMINOPHEN 325 MG PO TABS: 650.0000 mg | ORAL_TABLET | Freq: Four times a day (QID) | ORAL | Status: DC | PRN

## 2016-09-25 MED ORDER — SODIUM CHLORIDE 0.9 % IV SOLN
3.0000 g | INTRAVENOUS | Status: AC
  Administered 2016-09-25: 3 g via INTRAVENOUS
  Filled 2016-09-25: qty 3

## 2016-09-25 MED ORDER — FENOFIBRATE 160 MG PO TABS
160.0000 mg | ORAL_TABLET | Freq: Every day | ORAL | Status: DC
  Administered 2016-09-26 – 2016-10-02 (×4): 160 mg via ORAL
  Filled 2016-09-25 (×7): qty 1

## 2016-09-25 MED ORDER — SODIUM CHLORIDE 0.9 % IV BOLUS (SEPSIS)
1000.0000 mL | Freq: Once | INTRAVENOUS | Status: AC
  Administered 2016-09-25: 1000 mL via INTRAVENOUS

## 2016-09-25 MED ORDER — HYDRALAZINE HCL 20 MG/ML IJ SOLN: 5.0000 mg | Freq: Four times a day (QID) | INTRAMUSCULAR | Status: DC | PRN

## 2016-09-25 MED FILL — LIDOCAINE 2% VISCOUS SOLN: 2 | 20 days supply | Qty: 200 | Fill #0

## 2016-09-25 NOTE — Assessment & Plan Note (Signed)
Patient states that he has a history of chronic renal insufficiency; specifically with the left kidney.  He is followed by Dr. Neta Ehlers nephrologist in [place], [place].  Creatinine has increased from 1.9 up to 2.8 within the past 2 days.

## 2016-09-25 NOTE — Assessment & Plan Note (Signed)
Patient underwent a left partial glossectomy with neck dissection on 07/06/2016.  He is currently in the midst of radiation treatments on a daily basis Monday through Friday.  Patient is also scheduled to receive IV fluid rehydration in the symptom management clinic of the [facility] on Monday, 09/28/2016, Wednesday, 09/30/2016, and Friday, 10/02/2016.

## 2016-09-25 NOTE — Progress Notes (Signed)
Oncology Nurse Navigator Documentation  Visited Mr. Randy [name] [id number] to check on his well-being since admission from [facility] earlier today.  His wife was at the bedside. He reported "doing fine", appeared comfortable and relaxed. He voiced confidence the plan for abx and IVF over the weekend will be of benefit, denied any needs/concerns. He understands I will check with him again on Monday, expressed appreciation for my visit.  Gayleen Orem, RN, BSN, [place] [facility] Oncology Nurse [place] at [place] [phone]

## 2016-09-25 NOTE — Progress Notes (Addendum)
Randy [name] presents for his 15h fraction of radiation to his Tongue and head and neck. He reports pain a 10/10 to his mouth,throat and lipd. He does have ulcers to the inside of his bottom lip which have been present for several days. He reports thick saliva, dry mouth, and taste changes. He states he has a "head cold" with congestion, and a productive cough with brown sputum and blood when he blows his nose. He is not able to eat anything.   He can swallow a little water and Ensure,  He is drank  3 Ensure and 16 ozs. of water.  His neck is red, with a rash since before his surgery present to the right side of his neck. He is using sonafine twice daily.  Reports no bowel movement in a week has been taking Dulcolax daily.  Has not been able to use his cpap machine has been using a humidifier.  To receive IV fluids today. Wt Readings from Last 3 Encounters:  09/25/16 (!) 301 lb 9.6 oz (136.8 kg)  09/22/16 (!) 305 lb 3.2 oz (138.4 kg)  09/21/16 (!) 305 lb 9.6 oz (138.6 kg)  BP (!) 114/43   Pulse 67   Temp 99.7 F (37.6 C) (Oral)   Resp 18   Ht 5\' 11"  (1.803 m)   Wt (!) 301 lb 9.6 oz (136.8 kg)   SpO2 97%   BMI 42.06 kg/m  sitting BP 113/44 Pulse 72 standing

## 2016-09-25 NOTE — Progress Notes (Signed)
Oncology Nurse Navigator Documentation  To provide support, encouragement and care continuity, met with Randy [name] during WUT with Dr. Manning.  He was accompanied by his wife. He reported:  Drinking 3 Ensures and 32 oz water yesterday.  On track for same today.  MuGuard is helping with mucositis.  Voiced understanding of Dr. Manning's guidance to chew gum and clench over molars during RT. He understands to contact me with needs/concerns.  Rick Diehl, RN, BSN, CHPN [facility] Neck Oncology Nurse Navigator [facility] at [place] [phone]   

## 2016-09-25 NOTE — Progress Notes (Signed)
Patient c/o severe oral pain and is refusing oral meds at this time. Dr. Lonny Prude made aware. Lidocaine viscous oral solution administered per PRN orders. Will continue to monitor patient.

## 2016-09-25 NOTE — H&P (Addendum)
History and Physical    Randy [name] [id number] DOB: May 20, 1956 DOA: 09/25/2016  PCP: Drema Pry, DO  Patient coming from: Home  Chief Complaint: Oral pain  HPI: Randy [name] is a 60 y.o. male with medical history significant of tongue cancer, anxiety, CKD, mucositis, hyperlipidemia, hypertension, gout. Symptoms started about two weeks ago with oral pain secondary to radioation therapy. Initially pain was controlled with extra strength Tylenol, but started to worsen last weekend. He was seen this week and started on oxycodone and Oxycontin for pain, which have not helped much. He has been using Mugard, which has helped. He reports developing a fever last night to 101 degrees farenheit. He was seen today for follow-up and recommended for admission to treat severe mucositis with superinfection in addition to significant dehydration.  Review of Systems: Review of Systems  Constitutional: Positive for chills and fever.  HENT: Positive for sore throat. Negative for congestion.   Respiratory: Negative for cough, hemoptysis, sputum production, shortness of breath and wheezing.   Cardiovascular: Negative for chest pain and palpitations.  Gastrointestinal: Positive for constipation. Negative for abdominal pain, blood in stool, diarrhea, melena, nausea and vomiting.  Psychiatric/Behavioral: Negative for depression.  All other systems reviewed and are negative.   Past Medical History:  Diagnosis Date  . Arthritis    left knee  . Cancer ([place])   . Chronic kidney disease   . Diverticulitis   . Gout   . Hypertension     Past Surgical History:  Procedure Laterality Date  . COLON RESECTION SIGMOID    . colonscopy  11/2014  . KNEE ARTHROSCOPY Left   . left partial glossectomy with primary closure and left neck dissection Left 07/06/2016   Dr. Corliss Skains  . NASAL ENDOSCOPY     x 3     reports that he quit smoking about 20 years ago. His smoking use included Cigarettes. He has a  40.00 pack-year smoking history. He quit smokeless tobacco use about 45 years ago. He reports that he drinks about 24.0 oz of alcohol per week . He reports that he does not use drugs.  No Known Allergies  Family History  Problem Relation Age of Onset  . Cancer Mother     rectal  . Cancer Maternal Grandmother     unknown  . Cancer Paternal Grandmother     unknown  . Cancer Paternal Grandfather     unknown    Prior to Admission medications   Medication Sig Start Date End Date Taking? Authorizing Provider  allopurinol (ZYLOPRIM) 300 MG tablet Take 300 mg by mouth daily.   Yes Historical Provider, MD  ALPRAZolam Duanne Moron) 0.5 MG tablet Take 0.5 mg by mouth daily.  07/02/16  Yes Historical Provider, MD  atorvastatin (LIPITOR) 40 MG tablet Take 40 mg by mouth daily.   Yes Historical Provider, MD  chlorthalidone (HYGROTON) 25 MG tablet Take 25 mg by mouth daily.   Yes Historical Provider, MD  citalopram (CELEXA) 20 MG tablet Take 20 mg by mouth daily.   Yes Historical Provider, MD  ergocalciferol (VITAMIN D2) 50000 units capsule Take 50,000 Units by mouth every Monday.   Yes Historical Provider, MD  fenofibrate 160 MG tablet Take 160 mg by mouth daily.   Yes Historical Provider, MD  lisinopril (PRINIVIL,ZESTRIL) 40 MG tablet Take 40 mg by mouth daily.   Yes Historical Provider, MD  Multiple Vitamins-Minerals (MULTIVITAMIN ADULT PO) Take by mouth.   Yes Historical Provider, MD  Oral Wound Care Products (  MUGARD) LIQD Use as directed 5 mLs in the mouth or throat every 4 (four) hours as needed. Patient taking differently: Use as directed 5 mLs in the mouth or throat every 4 (four) hours as needed (mouth pain).  09/21/16  Yes Hayden Pedro, PA-C  oxyCODONE (OXY IR/ROXICODONE) 5 MG immediate release tablet Take 1 tablet (5 mg total) by mouth every 4 (four) hours as needed for severe pain. 09/21/16  Yes Hayden Pedro, PA-C  OXYCONTIN 10 MG 12 hr tablet Take 10 mg by mouth every 12  (twelve) hours. 09/21/16  Yes Historical Provider, MD  Wound Dressings (SONAFINE EX) Apply 1 application topically 2 (two) times daily as needed (irritation/wound care).    Yes Historical Provider, MD  lidocaine (XYLOCAINE) 2 % solution Using new clean q-tip swab, paint the lips up to 4 times daily before oral intake Patient not taking: Reported on 09/25/2016 09/25/16   Tyler Pita, MD  oxyCODONE (OXYCONTIN) 20 mg 12 hr tablet Take 1 tablet (20 mg total) by mouth every 12 (twelve) hours. Patient not taking: Reported on 09/25/2016 09/25/16   Tyler Pita, MD    Physical Exam: Vitals:   09/25/16 1538  BP: (!) 129/47  Pulse: 75  Resp: 18  Temp: 99 F (37.2 C)  TempSrc: Oral  SpO2: 96%  Weight: (!) 140.3 kg (309 lb 4.9 oz)  Height: 5\' 11"  (1.803 m)     Constitutional: NAD, calm, comfortable Eyes: PERRL, lids and conjunctivae normal ENMT: Mucous membranes are moist. Significant ulceration of lips and oropharynx Neck: normal, supple, no masses, no thyromegaly Respiratory: clear to auscultation bilaterally, no wheezing, no crackles. Normal respiratory effort. No accessory muscle use.  Cardiovascular: Regular rate and rhythm, no murmurs / rubs / gallops. No extremity edema. 2+ pedal pulses. Abdomen: no tenderness, no masses palpated.  Bowel sounds decreased.  Musculoskeletal: no clubbing / cyanosis. No joint deformity upper and lower extremities. Good ROM, no contractures. Normal muscle tone.  Skin: dermatitis changes around neck Neurologic: CN 2-12 grossly intact. Sensation intact, DTR normal. Strength 5/5 in all 4.  Psychiatric: Normal judgment and insight. Alert and oriented x 3. Normal mood.       Labs on Admission: I have personally reviewed following labs and imaging studies  CBC:  Recent Labs Lab 09/25/16 0939  WBC 4.8  NEUTROABS 3.1  HGB 9.3*  HCT 27.8*  MCV 88.5  PLT [id number]   Basic Metabolic Panel:  Recent Labs Lab 09/23/16 0858 09/25/16 0939  NA 131*  132*  K 4.4 4.7  CO2 24 22  GLUCOSE 90 102  BUN 36.1* 65.1*  CREATININE 1.9* 2.8*  CALCIUM 9.5 9.8  MG  --  2.2   GFR: Estimated Creatinine Clearance: 39.7 mL/min (by C-G formula based on SCr of 2.8 mg/dL (H)). Liver Function Tests:  Recent Labs Lab 09/25/16 0939  AST 13  ALT 10  ALKPHOS 36*  BILITOT 0.73  PROT 7.1  ALBUMIN 3.4*   Urine analysis:    Component Value Date/Time   LABSPEC 1.010 09/25/2016 1319   PHURINE 5.0 09/25/2016 1319   GLUCOSEU Negative 09/25/2016 1319   HGBUR Negative 09/25/2016 1319   BILIRUBINUR Negative 09/25/2016 1319   KETONESUR Negative 09/25/2016 1319   PROTEINUR Negative 09/25/2016 1319   UROBILINOGEN 0.2 09/25/2016 1319   NITRITE Negative 09/25/2016 1319   LEUKOCYTESUR Negative 09/25/2016 1319    Radiological Exams on Admission: No results found.   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 Secondary to radiation therapy. -Unasyn -Viscous lidocaine -Continue Mugard -Continue home oxycontin and oxycodone -Morphine prn if not able to take PO -bolus followed by D5 NS -IR for G-tube if possible while inpatient (could not do it this evening but will see patient in AM. Likely performed on Monday)  Acute on chronic CKD stage III Baseline creatinine of 1.9 two days ago. Up to 2.8 today with a BUN of 65.1 -IV fluid bolus -MIVF -recheck BMP in AM  Tongue cancer S/p resection with lymph node dissection and currently undergoing radiation therapy.  Hypertension -hold lisinopril and chlorthalidone secondary to AKI  Hyperlipidemia -continue atorvastatin and fenofibrate  Constipation Likely opiate induced -will try Miralax, Senokot-S and prn dulcolax suppository for now; may end up requiring Relistor if not tolerating  therapy   DVT prophylaxis: Lovenox Code Status: Full code Family Communication: None at bedside Disposition Plan: Discharge home in 2-3 days Consults called: Interventional radiology Admission status: Inpatient, medical floor   Cordelia Poche, MD [facility] Pager [phone]  If 7PM-7AM, please contact night-coverage [email or web address] Password [facility]  09/25/2016, 3:57 PM

## 2016-09-25 NOTE — Progress Notes (Signed)
Pharmacy Antibiotic Note  Randy [name] is a 60 y.o. male with PMH of tongue cancer currently receiving radiation therapy admitted on 09/25/2016 with concern for superinfection of mucositis. Pharmacy has been consulted for Unasyn dosing.  Plan: Unasyn 3g IV q8h. Monitor renal function, cultures as available, clinical course .  Height: 5\' 11"  (180.3 cm) Weight: (!) 309 lb 4.9 oz (140.3 kg) IBW/kg (Calculated) : 75.3  Temp (24hrs), Avg:99.3 F (37.4 C), Min:99 F (37.2 C), Max:99.7 F (37.6 C)   Recent Labs Lab 09/23/16 0858 09/25/16 0939  WBC  --  4.8  CREATININE 1.9* 2.8*    Estimated Creatinine Clearance: 39.7 mL/min (by C-G formula based on SCr of 2.8 mg/dL (H)).    No Known Allergies  Antimicrobials this admission: 12/15 >> Unasyn >>  Dose adjustments this admission: --  Microbiology results: None ordered  Thank you for allowing pharmacy to be a part of this patient's care.   Lindell Spar, PharmD, BCPS Pager: [phone] 09/25/2016 4:44 PM

## 2016-09-25 NOTE — Progress Notes (Signed)
Patient is a 60 year old male with history of tongue cancer. He is currently receiving radiation therapy. He is being transferred over from the cancer center for mucositis and dehydration with associated AKI. Concern for superinfection of mucositis. Blood pressure 114/43, pulse 67, temperature 99.58F, respirations 18. Patient accepted to medical floor for antibiotic treatment and IV fluids.  Cordelia Poche, MD [facility] 09/25/2016, 1:48 PM Pager: [phone]

## 2016-09-25 NOTE — Progress Notes (Signed)
SYMPTOM MANAGEMENT CLINIC    Chief Complaint: Mucositis, dehydration  HPI:  Randy [name] 60 y.o. male diagnosed with tongue cancer.  Currently undergoing radiation treatments.    No history exists.    Review of Systems  Constitutional: Positive for chills, fever, malaise/fatigue and weight loss.  HENT: Positive for sore throat.        Severe mucositis  All other systems reviewed and are negative.   Past Medical History:  Diagnosis Date  . Arthritis    left knee  . Cancer ([place])   . Chronic kidney disease   . Diverticulitis   . Gout   . Hypertension     Past Surgical History:  Procedure Laterality Date  . COLON RESECTION SIGMOID    . colonscopy  11/2014  . KNEE ARTHROSCOPY Left   . left partial glossectomy with primary closure and left neck dissection Left 07/06/2016   Dr. Corliss Skains  . 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    has No Known Allergies.  Allergies as of 09/25/2016   No Known Allergies     Medication List       Accurate as of 09/25/16  2:05 PM. Always use your most recent med list.          allopurinol 300 MG tablet Commonly known as:  ZYLOPRIM Take 300 mg by mouth daily.   ALPRAZolam 0.5 MG tablet Commonly known as:  XANAX   atorvastatin 10 MG tablet Commonly known as:  LIPITOR Take 40 mg by mouth daily.   chlorthalidone 25 MG tablet Commonly known as:  HYGROTON Take 25 mg by mouth daily.   citalopram 20 MG tablet Commonly known as:  CELEXA Take 20 mg by mouth daily.   fenofibrate 160 MG tablet Take 160 mg by mouth daily.   lidocaine 2 % solution Commonly known as:  XYLOCAINE Using new clean q-tip swab, paint the lips up to 4 times daily before oral intake   lisinopril 40 MG tablet Commonly known as:  PRINIVIL,ZESTRIL Take 40 mg by mouth daily.   MUGARD Liqd Use as directed 5 mLs in the mouth or throat every 4 (four) hours as needed.   MULTIVITAMIN ADULT PO Take by mouth.   oxyCODONE 5 MG immediate release tablet Commonly known as:  Oxy IR/ROXICODONE Take 1 tablet (5 mg total) by mouth every 4 (four) hours as needed for severe pain.   oxyCODONE 20 mg 12 hr tablet Commonly known as:  OXYCONTIN Take 1 tablet (20 mg total) by mouth every 12 (twelve) hours.   SONAFINE EX Apply topically.   VITAMIN D PO Take 50,000 Units by mouth once a week.        PHYSICAL EXAMINATION  Oncology Vitals 09/25/2016 09/25/2016  Height 180 cm -  Weight 136.442 kg -  Weight (lbs) 300 lbs 13 oz -  BMI (kg/m2) 41.95 kg/m2 -  Temp 99.1 -  Pulse 78 72  Resp 18 -  SpO2 92 -  BSA (m2) 2.61 m2 -   BP Readings from Last 2 Encounters:  09/25/16 (!) 123/50  09/25/16 (!) 113/44    Physical Exam  Constitutional: He is oriented to person, place, and time and well-developed, well-nourished, and in no distress.  HENT:  Head: Normocephalic and atraumatic.  Exam today reveals patient has severe mucositis to the entire oral mucosa; with multiple cracks and dried blood to his lips as well.  Patient has a very foul odor coming from his mouth region; and appears to have to spit out these purulent secretions due to his decreased ability to swallow them.    .         Eyes: Conjunctivae and EOM are normal. Pupils are equal, round, and reactive to light. Right eye exhibits no discharge. Left eye exhibits no discharge. No scleral icterus.  Neck: Normal range of motion. Neck supple. No JVD present. No tracheal deviation present. No thyromegaly present.  Cardiovascular: Normal rate, regular rhythm, normal heart sounds and intact distal pulses.   Pulmonary/Chest: Effort normal and breath sounds normal. No respiratory distress. He has no wheezes. He has no  rales. He exhibits no tenderness.  Abdominal: Soft. Bowel sounds are normal. He exhibits no distension and no mass. There is no tenderness. There is no rebound and no guarding.  Musculoskeletal: Normal range of motion. He exhibits no edema, tenderness or deformity.  Lymphadenopathy:    He has no cervical adenopathy.  Neurological: He is alert and oriented to person, place, and time. Gait normal.  Skin: Skin is warm and dry. No rash noted. No erythema. No pallor.  Psychiatric: Affect normal.  Nursing note and vitals reviewed.   LABORATORY DATA:. Appointment on 09/25/2016  Component Date Value Ref Range Status  . WBC 09/25/2016 4.8  4.0 - 10.3 10e3/uL Final  . NEUT# 09/25/2016 3.1  1.5 - 6.5 10e3/uL Final  . HGB 09/25/2016 9.3* 13.0 - 17.1 g/dL Final  . HCT 09/25/2016 27.8* 38.4 - 49.9 % Final  . Platelets 09/25/2016 184  140 - 400 10e3/uL Final  . MCV 09/25/2016 88.5  79.3 - 98.0 fL Final  . MCH 09/25/2016 29.6  27.2 - 33.4 pg Final  . MCHC 09/25/2016 33.5  32.0 - 36.0 g/dL Final  . RBC 09/25/2016 3.14* 4.20 - 5.82 10e6/uL Final  . RDW 09/25/2016 13.6  11.0 - 14.6 % Final  . lymph# 09/25/2016 0.5* 0.9 - 3.3 10e3/uL Final  . MONO# 09/25/2016 1.1* 0.1 - 0.9 10e3/uL Final  . Eosinophils Absolute 09/25/2016 0.1  0.0 - 0.5 10e3/uL Final  . Basophils Absolute 09/25/2016 0.0  0.0 - 0.1 10e3/uL Final  . NEUT% 09/25/2016 64.6  39.0 - 75.0 % Final  . LYMPH% 09/25/2016 10.3* 14.0 - 49.0 % Final  . MONO% 09/25/2016 23.6* 0.0 - 14.0 % Final  . EOS% 09/25/2016 1.3  0.0 - 7.0 % Final  . BASO% 09/25/2016 0.2  0.0 - 2.0 % Final  . Sodium 09/25/2016 132* 136 - 145 mEq/L Final  . Potassium 09/25/2016 4.7  3.5 - 5.1 mEq/L Final  . Chloride 09/25/2016 98  98 - 109 mEq/L Final  . CO2 09/25/2016 22  22 - 29 mEq/L Final  . Glucose 09/25/2016 102  70 - 140 mg/dl Final  . BUN 09/25/2016 65.1* 7.0 - 26.0 mg/dL Final  . Creatinine 09/25/2016 2.8* 0.7 - 1.3 mg/dL Final  . Total Bilirubin 09/25/2016 0.73   0.20 - 1.20 mg/dL Final  . Alkaline Phosphatase 09/25/2016 36* 40 - 150 U/L Final  . AST 09/25/2016 13  5 - 34 U/L Final  . ALT 09/25/2016 10  0 - 55 U/L Final  . Total Protein 09/25/2016 7.1  6.4 - 8.3 g/dL Final  . Albumin 09/25/2016 3.4* 3.5 - 5.0 g/dL Final  . Calcium 09/25/2016 9.8  8.4 - 10.4 mg/dL Final  . Anion Gap 09/25/2016 12* 3 - 11 mEq/L Final  . EGFR 09/25/2016 23* >90 ml/min/1.73 m2 Final  . Magnesium 09/25/2016 2.2  1.5 - 2.5 mg/dl Final  . Glucose 09/25/2016 Negative  Negative mg/dL Final  . Bilirubin (Urine) 09/25/2016 Negative  Negative Final  . Ketones 09/25/2016 Negative  Negative mg/dL Final  . Specific Gravity, Urine 09/25/2016 1.010  1.003 - 1.035 Final  . Blood 09/25/2016 Negative  Negative Final  . pH 09/25/2016 5.0  4.6 - 8.0 Final  . Protein 09/25/2016 Negative  Negative- <30 mg/dL Final  . Urobilinogen, UR 09/25/2016 0.2  0.2 - 1 mg/dL Final  . Nitrite 09/25/2016 Negative  Negative Final  . Leukocyte Esterase 09/25/2016 Negative  Negative Final  . RBC / HPF 09/25/2016 0-2  0 - 2 Final  . WBC, UA 09/25/2016 0-2  0 - 2 Final  . Bacteria, UA 09/25/2016 Few  Negative- Trace Final  . Casts 09/25/2016 Hyaline  Negative Final  . Epithelial Cells 09/25/2016 Occasional  Negative- Few Final  Hospital Outpatient Visit on 09/23/2016  Component Date Value Ref Range Status  . Sodium 09/23/2016 131* 136 - 145 mEq/L Final  . Potassium 09/23/2016 4.4  3.5 - 5.1 mEq/L Final  . Chloride 09/23/2016 98  98 - 109 mEq/L Final  . CO2 09/23/2016 24  22 - 29 mEq/L Final  . Glucose 09/23/2016 90  70 - 140 mg/dl Final  . BUN 09/23/2016 36.1* 7.0 - 26.0 mg/dL Final  . Creatinine 09/23/2016 1.9* 0.7 - 1.3 mg/dL Final  . Calcium 09/23/2016 9.5  8.4 - 10.4 mg/dL Final  . Anion Gap 09/23/2016 9  3 - 11 mEq/L Final  . EGFR 09/23/2016 38* >90 ml/min/1.73 m2 Final    RADIOGRAPHIC STUDIES: No results found.  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  He states that he has completed approximately 15 days of radiation treatment on his tongue cancer; and has approximately 15 days left of the radiation treatment as well.  He states that he has been using the "Mugard" rinse.  He was prescribed with only minimal effectiveness.  He states that he has had both his oxycodone and his OxyContin dose is increased; which has helped with the discomfort somewhat.  He states that he has been able to eat any food for over 1 week; and it only taken tiny sips of water occasionally.  He feels dehydrated today.  Of note-patient was examined by Dr. Enrique Sack dentist yesterday as well.  Patient presented to the symptomatic management clinic here at the [facility] today for IV fluid rehydration.  Exam today reveals patient has severe mucositis to the entire oral mucosa; with multiple cracks and dried blood to his lips as well.  Patient has a very foul odor coming from his mouth region; and appears to have to spit out these purulent secretions due to his decreased ability to swallow them.  Patient appears very dehydrated today; and the nurse was only able to obtain a 24-gauge peripheral IV to patient's left hand.  Patient has received approximately 800 mL normal saline IV fluid rehydration at this point.  See further notes for details of today's visit.  Reviewed all findings with Dr. Tammi Klippel radiation oncologist; he was in agreement that patient should be admitted for IV fluid rehydration, mucositis, care, and IV antibiotics.  Should also consider Diflucan for  treatment of any yeast-related mucositis as well.  Patient has a history of chronic renal insufficiency; and creatinine has increased from 1.9 up to 2.8 today.  Patient states that he has been followed in the past for renal failure per Dr. Neta Ehlers nephrologist in [place], [place].   Also of note-patient has requested PEG placement for  nutrition.  It does appear the patient has a PEG placement scheduled for 10/01/2016.  Should also consider the placement of a PICC line if patient is unable to taking any oral intake.  Brief history and report were called to Dr. Lonny Prude hospitalist; prior to the patient being transported up to the room the wheelchair.  Per the cancer Center nurse.  CODE STATUS: Patient should be considered a full code; since there are no advanced directives in the patient's chart.  Fever Patient states that he had a fever of 101.1 last night; but today.  Patient's temperature is 99.1.  He states that he has also had a cold/URI; and coughs up some thick brown secretions at times.  Exam today reveals lung sounds clear bilaterally with no cough or wheeze noted.  Patient does have a significant amount of exudative mucositis with a foul odor.  It is highly possible that the fever is secondary to the mucositis issues.  Have obtained blood cultures 2 and urine/urine culture; but has not reviewed results as of yet.  Recommend IV antibiotics (with pharmacy input regarding renal dosing); and consideration of diflucan as well.   Dehydration Patient has severe mucositis and is unable to take in anything other than small sips of water.  He has had no food in greater than a week.  He has a PEG tube placement scheduled for 10/01/2016.  He has received IV fluid rehydration while the cancer Center today.  See further notes for details of today's visit.  Patient will be direct admitted to the hospital for further evaluation and management today.  Chronic renal insufficiency Patient states that he has a history of chronic renal insufficiency; specifically with the left kidney.  He is followed by Dr. Neta Ehlers nephrologist in [place], [place].  Creatinine has increased from 1.9 up to 2.8 within the past 2 days.  Cancer of dorsal tongue [facility]) Patient underwent a left partial glossectomy with neck dissection on 07/06/2016.   He is currently in the midst of radiation treatments on a daily basis Monday through Friday.  Patient is also scheduled to receive IV fluid rehydration in the symptom management clinic of the [facility] on Monday, 09/28/2016, Wednesday, 09/30/2016, and Friday, 10/02/2016.   Patient stated understanding of all instructions; and was in agreement with this plan of care. The patient knows to call the clinic with any problems, questions or concerns.   Total time spent with patient was 40 minutes;  with greater than 75 percent of that time spent in face to face counseling regarding patient's symptoms,  and coordination of care and follow up.  Disclaimer:This dictation was prepared with Dragon/digital dictation along with [employer]. Any transcriptional errors that result from this process are unintentional.  Drue Second, NP 09/25/2016

## 2016-09-25 NOTE — Assessment & Plan Note (Signed)
Patient presented to the [facility] today with complaint of severe mucositis and mouth pain.  He states that he has completed approximately 15 days of radiation treatment on his tongue cancer; and has approximately 15 days left of the radiation treatment as well.  He states that he has been using the "Mugard" rinse.  He was prescribed with only minimal effectiveness.  He states that he has had both his oxycodone and his OxyContin dose is increased; which has helped with the discomfort somewhat.  He states that he has been able to eat any food for over 1 week; and it only taken tiny sips of water occasionally.  He feels dehydrated today.  Of note-patient was examined by Dr. Enrique Sack dentist yesterday as well.  Patient presented to the symptomatic management clinic here at the [facility] today for IV fluid rehydration.  Exam today reveals patient has severe mucositis to the entire oral mucosa; with multiple cracks and dried blood to his lips as well.  Patient has a very foul odor coming from his mouth region; and appears to have to spit out these purulent secretions due to his decreased ability to swallow them.  Patient appears very dehydrated today; and the nurse was only able to obtain a 24-gauge peripheral IV to patient's left hand.  Patient has received approximately 800 mL normal saline IV fluid rehydration at this point.  See further notes for details of today's visit.  Reviewed all findings with Dr. Tammi Klippel radiation oncologist; he was in agreement that patient should be admitted for IV fluid rehydration, mucositis, care, and IV antibiotics.  Should also consider Diflucan for treatment of any yeast-related mucositis as well.  Patient has a history of chronic renal insufficiency; and creatinine has increased from 1.9 up to 2.8 today.  Patient states that he has been followed in the past for renal failure per Dr. Neta Ehlers nephrologist in [place], [place].   Also of note-patient has requested PEG  placement for nutrition.  It does appear the patient has a PEG placement scheduled for 10/01/2016.  Should also consider the placement of a PICC line if patient is unable to taking any oral intake.  Brief history and report were called to Dr. Lonny Prude hospitalist; prior to the patient being transported up to the room the wheelchair.  Per the cancer Center nurse.  CODE STATUS: Patient should be considered a full code; since there are no advanced directives in the patient's chart.

## 2016-09-25 NOTE — Assessment & Plan Note (Signed)
Patient has severe mucositis and is unable to take in anything other than small sips of water.  He has had no food in greater than a week.  He has a PEG tube placement scheduled for 10/01/2016.  He has received IV fluid rehydration while the cancer Center today.  See further notes for details of today's visit.  Patient will be direct admitted to the hospital for further evaluation and management today.

## 2016-09-25 NOTE — Progress Notes (Signed)
Being admitted to [id number]. IV saline locked.  Transported via w/c with pt's wife in attencdance.  Report given to Festus Holts, RN per Selena Lesser, NP

## 2016-09-25 NOTE — Progress Notes (Signed)
Radiation Oncology         [phone]   Name: Randy [name] MRN: [id number]   Date: 09/25/2016  DOB: 11/20/1955     Weekly Radiation Therapy Management    ICD-9-CM ICD-10-CM   1. Cancer of dorsal tongue (HCC) 141.1 C02.0 oxyCODONE (OXYCONTIN) 20 mg 12 hr tablet  2. 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  Randy [name] presents for his 15th fraction of radiation to his Tongue and head and neck. He reports pain as a 10/10 to his mouth, throat, and lips. He does have ulcers to the inside of his bottom lip which have been present for several days. He reports thick saliva, dry mouth, and taste changes. He states he has a "head cold" with congestion and a productive cough with brown sputum and blood when he blows his nose. He is not able to eat anything. He can swallow a little water and Ensure, and reports he has not changed his food intake since starting radiation. He is drinking about 5-7 sixteen ounce bottles of water daily, maybe more. His neck is red, with a rash, since before his surgery present in the right side of his neck. He is using sonafine twice daily. Reports not bowel movements in a week and has been taking Dulcolax daily. E has not been able to use his CPAP machine and have been using a humidifier. To receive IV fluids today. He reports he's feeling horrible today. And ran a fever last night of 101 an took tylenol for it. Dr. Enrique Sack saw the patient and decided not to use scatter guards since the patient has discomfort. Gayleen Orem, RN, our Head and Neck Oncology Navigator was present during this encounter. Oxy IR every 4 hours. Oxycontin every 12 hours.  Set-up films were reviewed. The chart was checked.  Physical Findings  height is 5\' 11"  (1.803 m) and weight is 301 lb 9.6 oz (136.8 kg)  (abnormal). His oral temperature is 99.7 F (37.6 C). His blood pressure is 113/44 (abnormal) and his pulse is 72. His respiration is 18 and oxygen saturation is 97%. . Weight loss of 18 lbs since 08/24/16. Alert, in no acute distress. Patient has multiple ulcerations of the oral mucosa including the outer lips of the mouth, and involving the buccal mucosa and posterior oropharynx. No candida like plaques are noted. The appearing is suspicious for mucositis consistent with radiation effects.   Impression The patient is tolerating radiation. Grade III mucositis.  Plan We will continue treatment as planned. -I advised the patient that since the scatter guards cause him discomfort, he should chew sugarfree gum and use the gum to cover his teeth with it during the treatment to protect this teeth. -The patient can hold off on his BP medication for now. -Filled Oxycontin 20 mg, 1 tablet every 12 hours. -Filled xylocaine to coat his lips prior to oral intake. -If he develops a fever at home, I advised him to use children's tylenol and to contact us.   ------------------------------------------------   Tyler Pita, MD [facility] Director and Director of Stereotactic Radiosurgery Direct Dial: [phone]  Fax: [phone] [email or web address]  Skype  LinkedIn  This document serves as a record of services personally performed by Tyler Pita, MD. It was created on his  behalf by Darcus Austin, a trained medical scribe. The creation of this record is based on the scribe's personal observations and the provider's statements to them. This document has been checked and approved by the attending provider.

## 2016-09-25 NOTE — Assessment & Plan Note (Signed)
Patient states that he had a fever of 101.1 last night; but today.  Patient's temperature is 99.1.  He states that he has also had a cold/URI; and coughs up some thick brown secretions at times.  Exam today reveals lung sounds clear bilaterally with no cough or wheeze noted.  Patient does have a significant amount of exudative mucositis with a foul odor.  It is highly possible that the fever is secondary to the mucositis issues.  Have obtained blood cultures 2 and urine/urine culture; but has not reviewed results as of yet.  Recommend IV antibiotics (with pharmacy input regarding renal dosing); and consideration of diflucan as well.

## 2016-09-26 ENCOUNTER — Encounter (HOSPITAL_COMMUNITY): Payer: Self-pay | Admitting: Surgery

## 2016-09-26 ENCOUNTER — Inpatient Hospital Stay (HOSPITAL_COMMUNITY): Payer: Managed Care, Other (non HMO)

## 2016-09-26 DIAGNOSIS — K1230 Oral mucositis (ulcerative), unspecified: Secondary | ICD-10-CM | POA: Diagnosis present

## 2016-09-26 DIAGNOSIS — R1312 Dysphagia, oropharyngeal phase: Secondary | ICD-10-CM

## 2016-09-26 LAB — BASIC METABOLIC PANEL
Anion gap: 10 (ref 5–15)
BUN: 53 mg/dL — ABNORMAL HIGH (ref 6–20)
CALCIUM: 9 mg/dL (ref 8.9–10.3)
CO2: 23 mmol/L (ref 22–32)
CREATININE: 1.88 mg/dL — AB (ref 0.61–1.24)
Chloride: 102 mmol/L (ref 101–111)
GFR, EST AFRICAN AMERICAN: 43 mL/min — AB (ref >60)
GFR, EST NON AFRICAN AMERICAN: 37 mL/min — AB (ref >60)
Glucose, Bld: 122 mg/dL — ABNORMAL HIGH (ref 65–99)
Potassium: 4.4 mmol/L (ref 3.5–5.1)
SODIUM: 135 mmol/L (ref 135–145)

## 2016-09-26 LAB — CBC
HCT: 26.1 % — ABNORMAL LOW (ref 39.0–52.0)
HEMOGLOBIN: 8.7 g/dL — AB (ref 13.0–17.0)
MCH: 29.7 pg (ref 26.0–34.0)
MCHC: 33.3 g/dL (ref 30.0–36.0)
MCV: 89.1 fL (ref 78.0–100.0)
PLATELETS: 206 10*3/uL (ref 150–400)
RBC: 2.93 MIL/uL — AB (ref 4.22–5.81)
RDW: 13.6 % (ref 11.5–15.5)
WBC: 4 10*3/uL (ref 4.0–10.5)

## 2016-09-26 LAB — URINE CULTURE: ORGANISM ID, BACTERIA: NO GROWTH

## 2016-09-26 MED ORDER — FLUCONAZOLE IN SODIUM CHLORIDE 100-0.9 MG/50ML-% IV SOLN
100.0000 mg | INTRAVENOUS | Status: AC
  Administered 2016-09-27 – 2016-09-30 (×4): 100 mg via INTRAVENOUS
  Filled 2016-09-26 (×5): qty 50

## 2016-09-26 MED ORDER — ENOXAPARIN SODIUM 40 MG/0.4ML ~~LOC~~ SOLN
40.0000 mg | SUBCUTANEOUS | Status: AC
  Administered 2016-09-26 – 2016-09-27 (×2): 40 mg via SUBCUTANEOUS
  Filled 2016-09-26 (×2): qty 0.4

## 2016-09-26 MED ORDER — SODIUM CHLORIDE 0.9 % IV SOLN
3.0000 g | Freq: Four times a day (QID) | INTRAVENOUS | Status: DC
  Administered 2016-09-26 – 2016-10-01 (×20): 3 g via INTRAVENOUS
  Filled 2016-09-26 (×22): qty 3

## 2016-09-26 MED ORDER — BISACODYL 10 MG RE SUPP
10.0000 mg | Freq: Every day | RECTAL | Status: AC
  Administered 2016-09-26 – 2016-09-27 (×2): 10 mg via RECTAL
  Filled 2016-09-26 (×2): qty 1

## 2016-09-26 MED ORDER — MENTHOL 3 MG MT LOZG: 1.0000 | LOZENGE | OROMUCOSAL | Status: DC | PRN

## 2016-09-26 MED ORDER — ENOXAPARIN SODIUM 40 MG/0.4ML ~~LOC~~ SOLN: 40.0000 mg | SUBCUTANEOUS | Status: DC

## 2016-09-26 MED ORDER — FLUCONAZOLE IN SODIUM CHLORIDE 200-0.9 MG/100ML-% IV SOLN
200.0000 mg | Freq: Once | INTRAVENOUS | Status: AC
  Administered 2016-09-26: 200 mg via INTRAVENOUS
  Filled 2016-09-26: qty 100

## 2016-09-26 MED ORDER — LIP MEDEX EX OINT
1.0000 "application " | TOPICAL_OINTMENT | Freq: Two times a day (BID) | CUTANEOUS | Status: DC
  Administered 2016-09-27 – 2016-10-02 (×12): 1 via TOPICAL
  Filled 2016-09-26: qty 7

## 2016-09-26 MED ORDER — BISACODYL 10 MG RE SUPP: 10.0000 mg | Freq: Two times a day (BID) | RECTAL | Status: DC | PRN

## 2016-09-26 MED ORDER — CHLORHEXIDINE GLUCONATE CLOTH 2 % EX PADS: 6.0000 | MEDICATED_PAD | Freq: Once | CUTANEOUS | Status: DC

## 2016-09-26 MED ORDER — PHENOL 1.4 % MT LIQD: 2.0000 | OROMUCOSAL | Status: DC | PRN

## 2016-09-26 MED ORDER — MAGIC MOUTHWASH
15.0000 mL | Freq: Four times a day (QID) | ORAL | Status: DC | PRN
  Administered 2016-09-28 – 2016-10-01 (×4): 15 mL via ORAL
  Filled 2016-09-26 (×4): qty 15

## 2016-09-26 MED ORDER — DEXTROSE 5 % IV SOLN
2.0000 g | INTRAVENOUS | Status: DC
  Filled 2016-09-26: qty 2

## 2016-09-26 NOTE — Assessment & Plan Note (Signed)
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

## 2016-09-26 NOTE — Progress Notes (Signed)
Aware of request for g-tube placement.  He will need a CT abdomen first to evaluate his anatomy for placement.  This has been ordered.  Randy [name] E

## 2016-09-26 NOTE — Progress Notes (Signed)
The patient has had a  CT scan to evaluate his anatomy for a percutaneous g-tube placement.  Unfortunately, his stomach and transverse colon are high and not amenable to percutaneous placement.  We would recommend a surgical evaluation for g-tube placement.  I will call his primary team to inform them of this finding.  Baby Stairs E 1:05 PM 09/26/2016

## 2016-09-26 NOTE — Consult Note (Signed)
[facility]  [address] [place], [place] [address] Phone: [phone] FAX: [phone]     SYE SCHROEPFER  May 31, 1956 [id number]  CARE TEAM:  PCP: Drema Pry, DO  Outpatient Care Team: Patient Care Team: Doe-Hyun Kyra Searles, DO as PCP - General  Inpatient Treatment Team: Treatment Team: Attending Provider: Florencia Reasons, MD; Registered Nurse: Don Perking, RN; Rounding Team: Fatima Blank, MD; Registered Nurse: Lottie Dawson, RN; Technician: Ardelia Mems, NT; Consulting Physician: Nolon Nations, MD   This patient is a 60 y.o.male who presents today for surgical evaluation at the request of Dr. Florencia Reasons.   Reason for evaluation: Severe dysphagia & mucositis, requiring feeding tube placement  Obese former heavy smoking male with diagnosis of squamous cell carcinoma of the tongue.  Had persistent ulcer on his tongue.  Saw his dentist.  Sent for incisional biopsy.  Went down to [place] and had triple endoscopy, left partial glossectomy, primary closure, left neck dissection levels one through four.  Stage II T2 N1 squamous cell cancer.  It is undergone post-adjuvant radiation therapy under the care of Dr. Tammi Klippel here at the [facility] center.  Sign worsening swelling and difficulty swallowing.  Admitted.    Would benefit from enteral nutrition until dysphagia resolves or can be stabilized.  Initially tried to see if could be done in interventional radiology.  However colon appears to be high riding and stomach rather small.  Therefore, not felt to be a good candidate for interventional percutaneous gastrostomy tube.  Therefore, surgical consultation made for open versus laparoscopic gastrostomy tube placement.  Patient had an lap assisted sigmoid colectomy for recurrent diverticulitis by our group in 2004.  Dr. Zella Richer.  No other abdominal surgery.  No history of any gastric surgery.  No history of ulcers.  No significant  heartburn/reflux.  Denies any history of heart attacks or strokes.  Claims he can walk 20 minutes without difficulty.  No exertional chest pain.  He has not smoked for quite many years.   Assessment  Hines Kloss Wayne  60 y.o. 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  Because he cannot tolerate anything by now, he requires feeding tube placement.   not able to do endoscopic PEG given tongue cancer swelling radiation.  [place] for percutaneous approach by interventional radiology.  Therefore plan placement through abdominal wall.  Most likely open versus lap assisted.  Have posted for Monday for my partner, Dr Marlou Starks.  The anatomy & physiology of the foregut and digestive tract was discussed.  Natural history risks without surgery was discussed.   The patient's situation is not adequately controlled by nonoperative interventions by gastroenterology and interventional radiology.  I feel the risks of no intervention will lead to serious problems that outweigh the operative risks; therefore, I recommended surgery to place a feeding tube into the stomach.  Need for a thorough workup to rule out the differential diagnosis and plan treatment was explained.  I explained laparoscopic techniques with  possible need for an open approach.  Risks such as bleeding, infection, abscess, leak, injury to other organs, need for repair of tissues / organs, need for further treatment, heart attack, death, and other risks were discussed.   I noted a good likelihood this will help address the problem.   Goals of post-operative recovery were discussed as well.  Possibility that this will not correct all symptoms was explained.  We will work to minimize complications.   Educational handouts further explaining the pathology, treatment options, and dysphagia diet was given as well.  Questions were answered.  The patient & his wife express understanding & wish to proceed with surgery.   Hopefully this is a transient problem that will improve after completion of radiation therapy in a few months  -VTE prophylaxis- SCDs, etc -mobilize as tolerated to help recovery    Adin Hector, M.D., F.A.C.S. Gastrointestinal and Minimally Invasive Surgery [facility] [address], [place], [place] [address] [phone] Main / Paging   09/26/2016      Past Medical History:  Diagnosis Date  . Arthritis    left knee  . Cancer ([place])   . Chronic kidney disease   . Diverticulitis   . Gout   . Hypertension     Past Surgical History:  Procedure Laterality Date  . COLON RESECTION SIGMOID    . colonscopy  11/2014  . KNEE ARTHROSCOPY Left   . left partial glossectomy with primary closure and left neck dissection Left 07/06/2016   Dr. Corliss Skains  . NASAL ENDOSCOPY     x 3    Social History   Social History  . Marital status: Married    Spouse name: N/A  . Number of children: 2  . Years of education: N/A   Occupational History  . Not on file.   Social History Main Topics  . Smoking status: Former Smoker    Packs/day: 2.00    Years: 20.00    Types: Cigarettes    Quit date: 10/13/1995  . Smokeless tobacco: Former Systems developer    Quit date: 1972  . Alcohol use 24.0 oz/week    40 Cans of beer per week     Comment: quit 07/05/16  . Drug use: No  . Sexual activity: Not Currently   Other Topics Concern  . Not on file   Social History Narrative   Patient is married with 2 step daughters.   Patient with history of smoking 2 packs per day for 20 years.  Patient quit in 1997.   The previous use of smokeless tobacco at the age of 64 but quit at that time.   Patient with previous history of extensive beer drinking. But none recently since his cancer diagnosis.    Patient denies any other illicit drug use.    Patient is a Administrator.          Family History  Problem Relation Age of Onset  . Cancer Mother     rectal  . Cancer Maternal Grandmother     unknown  . Cancer Paternal Grandmother     unknown  . Cancer Paternal Grandfather     unknown    Current Facility-Administered Medications  Medication Dose Route Frequency Provider Last Rate Last Dose  . acetaminophen (TYLENOL) tablet 650 mg  650 mg Oral Q6H PRN Mariel Aloe, MD       Or  . acetaminophen (TYLENOL) suppository 650 mg  650 mg Rectal Q6H PRN  Mariel Aloe, MD      . allopurinol (ZYLOPRIM) tablet 300 mg  300 mg Oral Daily Mariel Aloe, MD   300 mg at 09/26/16 1014  . ALPRAZolam Duanne Moron) tablet 0.5 mg  0.5 mg Oral Daily Mariel Aloe, MD   0.5 mg at 09/26/16 1014  . Ampicillin-Sulbactam (UNASYN) 3 g in sodium chloride 0.9 % 100 mL IVPB  3 g Intravenous Q6H Berton Mount, RPH   3 g at 09/26/16 1346  . atorvastatin (LIPITOR) tablet 40 mg  40 mg Oral Daily Mariel Aloe, MD   40 mg at 09/26/16 1014  . bisacodyl (DULCOLAX) suppository 10 mg  10 mg Rectal Daily Florencia Reasons, MD   10 mg at 09/26/16 1308  . [START ON 09/27/2016] cefoTEtan (CEFOTAN) 2 g in dextrose 5 % 50 mL IVPB  2 g Intravenous On Call to [place], MD      . Chlorhexidine Gluconate Cloth 2 % PADS 6 each  6 each Topical Once Cal Boston, MD      . citalopram (CELEXA) tablet 20 mg  20 mg Oral Daily Mariel Aloe, MD   20 mg at 09/26/16 1014  . dextrose 5 %-0.9 % sodium chloride infusion   Intravenous Continuous Mariel Aloe, MD 100 mL/hr at 09/26/16 0451    . fenofibrate tablet 160 mg  160 mg Oral QAC breakfast Mariel Aloe, MD   160 mg at 09/26/16 0752  . [START ON 09/27/2016] fluconazole  (DIFLUCAN) IVPB 100 mg  100 mg Intravenous Q24H Florencia Reasons, MD      . hydrALAZINE (APRESOLINE) injection 5 mg  5 mg Intravenous Q6H PRN Mariel Aloe, MD      . lidocaine (XYLOCAINE) 2 % viscous mouth solution 15 mL  15 mL Mouth/Throat Q6H PRN Mariel Aloe, MD   15 mL at 09/25/16 1800  . magic mouthwash w/lidocaine  5 mL Oral TID PRN Reyne Dumas, MD   5 mL at 09/26/16 1307  . morphine 4 MG/ML injection 4 mg  4 mg Intravenous Q4H PRN Mariel Aloe, MD   4 mg at 09/26/16 1307  . MUGARD LIQD 5 mL  5 mL Mouth/Throat Q4H PRN Mariel Aloe, MD      . oxyCODONE (Oxy IR/ROXICODONE) immediate release tablet 5 mg  5 mg Oral Q4H PRN Mariel Aloe, MD   5 mg at 09/26/16 0441  . oxyCODONE (OXYCONTIN) 12 hr tablet 10 mg  10 mg Oral Q12H Mariel Aloe, MD   10 mg at 09/26/16 1014  . polyethylene glycol (MIRALAX / GLYCOLAX) packet 17 g  17 g Oral Daily Mariel Aloe, MD   17 g at 09/26/16 1016  . senna-docusate (Senokot-S) tablet 1 tablet  1 tablet Oral BID Mariel Aloe, MD   1 tablet at 09/26/16 1014  . [START ON 09/28/2016] Vitamin D (Ergocalciferol) (DRISDOL) capsule 50,000 Units  50,000 Units Oral Q Mon Mariel Aloe, MD         No Known Allergies  ROS: Constitutional:  No fevers, chills, sweats.  Weight stable Eyes:  No vision changes, No discharge HENT:  No sore throats, nasal drainage Lymph: No neck swelling, No bruising easily Pulmonary:  No cough, productive sputum CV: No orthopnea, PND   No exertional chest/neck/shoulder/arm pain. GI: No personal nor family history of GI/colon cancer, inflammatory bowel disease, irritable bowel syndrome, allergy such as Celiac Sprue, dietary/dairy problems, colitis, ulcers nor gastritis.  No  recent sick contacts/gastroenteritis.  No travel outside the country.   Renal: No UTIs, No hematuria Genital:  No drainage, bleeding, masses Musculoskeletal: No severe joint pain.  Good ROM major joints Skin:  No sores or lesions.  No rashes Heme/Lymph:  No easy  bleeding.  No swollen lymph nodes Neuro: No focal weakness/numbness.  No seizures Psych: No suicidal ideation.  No hallucinations  BP (!) 109/56 (BP Location: Right Arm)   Pulse 67   Temp 99.2 F (37.3 C) (Axillary)   Resp 16   Ht 5' 11" (1.803 m)   Wt (!) 140.3 kg (309 lb 4.9 oz)   SpO2 94%   BMI 43.14 kg/m   Physical Exam: General: Pt awake/alert/oriented x4 in no major acute distress.  Rather tired but not toxic.   Eyes: PERRL, normal EOM. Sclera nonicteric Neuro: CN II-XII intact w/o focal sensory/motor deficits. Lymph: No head/neck/groin lymphadenopathy Psych:  No delerium/psychosis/paranoia HENT: Normocephalic, Mucus membranes  inflamed and edematous.  No thrush.  Blistering and ulcerations on tongue.  Some difficulty with speech  Neck: Supple, No tracheal deviation Chest: No pain.  Good respiratory excursion. CV:  Pulses intact.  Regular rhythm Abdomen: Morbidly obese but soft, Nondistended.  Nontender.   Infraumbilical midline incision closed without hernia.  No incarcerated hernias. Gen:  No inguinal hernias.  No inguinal lymphadenopathy.   Ext:  SCDs BLE.  No significant edema.  No cyanosis Skin: No petechiae / purpurea.  No major sores Musculoskeletal: No severe joint pain.  Good ROM major joints   Results:   Labs: Results for orders placed or performed during the hospital encounter of 09/25/16 (from the past 48 hour(s))  Basic metabolic panel     Status: Abnormal   Collection Time: 09/26/16  3:53 AM  Result Value Ref Range   Sodium 135 135 - 145 mmol/L   Potassium 4.4 3.5 - 5.1 mmol/L   Chloride 102 101 - 111 mmol/L   CO2 23 22 - 32 mmol/L   Glucose, Bld 122 (H) 65 - 99 mg/dL   BUN 53 (H) 6 - 20 mg/dL   Creatinine, Ser 1.88 (H) 0.61 - 1.24 mg/dL   Calcium 9.0 8.9 - 10.3 mg/dL   GFR calc non Af Amer 37 (L) >60 mL/min   GFR calc Af Amer 43 (L) >60 mL/min    Comment: (NOTE) The eGFR has been calculated using the CKD EPI equation. This calculation has not  been validated in all clinical situations. eGFR's persistently <60 mL/min signify possible Chronic Kidney Disease.    Anion gap 10 5 - 15  CBC     Status: Abnormal   Collection Time: 09/26/16  3:53 AM  Result Value Ref Range   WBC 4.0 4.0 - 10.5 K/uL   RBC 2.93 (L) 4.22 - 5.81 MIL/uL   Hemoglobin 8.7 (L) 13.0 - 17.0 g/dL   HCT 26.1 (L) 39.0 - 52.0 %   MCV 89.1 78.0 - 100.0 fL   MCH 29.7 26.0 - 34.0 pg   MCHC 33.3 30.0 - 36.0 g/dL   RDW 13.6 11.5 - 15.5 %   Platelets 206 150 - 400 K/uL    Imaging / Studies: Ct Abdomen Wo Contrast  Addendum Date: 09/26/2016   ADDENDUM REPORT: 09/26/2016 13:27 ADDENDUM: The CT examination was performed for assessment of anatomy prior to potential percutaneous gastrostomy tube placement secondary to oral mucositis from radiation therapy to treat tongue carcinoma. The study shows unfavorable anatomy for gastrostomy tube placement with a high stomach present  and a high transverse colon which lies anterior to the majority of the body of the stomach. Surgical consultation is recommended to determine if the patient would be a candidate for surgical gastrostomy or jejunostomy. Electronically Signed   By: Aletta Edouard M.D.   On: 09/26/2016 13:27   Result Date: 09/26/2016 CLINICAL DATA:  Evaluate for G-tube placement. EXAM: CT ABDOMEN WITHOUT CONTRAST TECHNIQUE: Multidetector CT imaging of the abdomen was performed following the standard protocol without IV contrast. COMPARISON:  None. FINDINGS: Lower chest: Heart is borderline in size. Linear atelectasis or scarring in the lung bases. No effusions. Hepatobiliary: No visible focal hepatic abnormality. High-density layering material in the gallbladder could reflect small layering stones. Pancreas: No focal abnormality or ductal dilatation. Spleen: No focal abnormality.  Normal size. Adrenals/Urinary Tract: Bilateral perinephric stranding. No hydronephrosis. Low-density areas in the kidneys bilaterally likely reflects  cysts. Adrenal glands unremarkable. Stomach/Bowel: Stomach, visualized large and small bowel decompressed and unremarkable. Vascular/Lymphatic: Aortic and proximal common iliac calcifications. No aneurysm. No adenopathy. Other: No free fluid or free air. Musculoskeletal: No acute bony abnormality or focal bone lesion. IMPRESSION: Mild bilateral perinephric stranding. No hydronephrosis. This likely is related to of prior insults. Bilateral renal cysts. No acute findings in the abdomen or pelvis. Aortoiliac atherosclerosis. Electronically Signed: By: Rolm Baptise M.D. On: 09/26/2016 09:21    Medications / Allergies: per chart  Antibiotics: Anti-infectives    Start     Dose/Rate Route Frequency Ordered Stop   09/27/16 1000  fluconazole (DIFLUCAN) IVPB 100 mg     100 mg 50 mL/hr over 60 Minutes Intravenous Every 24 hours 09/26/16 0942 10/01/16 0959   09/27/16 0600  cefoTEtan (CEFOTAN) 2 g in dextrose 5 % 50 mL IVPB     2 g 100 mL/hr over 30 Minutes Intravenous On call to O.R. 09/26/16 1417 09/28/16 0559   09/26/16 1400  Ampicillin-Sulbactam (UNASYN) 3 g in sodium chloride 0.9 % 100 mL IVPB     3 g 200 mL/hr over 30 Minutes Intravenous Every 6 hours 09/26/16 1020     09/26/16 1000  fluconazole (DIFLUCAN) IVPB 200 mg     200 mg 100 mL/hr over 60 Minutes Intravenous  Once 09/26/16 0942 09/26/16 1115   09/26/16 0000  Ampicillin-Sulbactam (UNASYN) 3 g in sodium chloride 0.9 % 100 mL IVPB  Status:  Discontinued     3 g 200 mL/hr over 30 Minutes Intravenous Every 8 hours 09/25/16 1643 09/26/16 1020   09/25/16 1630  Ampicillin-Sulbactam (UNASYN) 3 g in sodium chloride 0.9 % 100 mL IVPB     3 g 200 mL/hr over 30 Minutes Intravenous STAT 09/25/16 1609 09/25/16 1713        Note: Portions of this report may have been transcribed using voice recognition software. Every effort was made to ensure accuracy; however, inadvertent computerized transcription errors may be present.   Any transcriptional  errors that result from this process are unintentional.    Adin Hector, M.D., F.A.C.S. Gastrointestinal and Minimally Invasive Surgery [facility] [address] [place], [place] [address] [phone] Main / Paging   09/26/2016

## 2016-09-26 NOTE — Progress Notes (Signed)
PROGRESS NOTE  Randy [name] [id number] DOB: 06/27/1956 DOA: 09/25/2016 PCP: Drema Pry, DO  HPI/Recap of past 24 hours:  C/o mouth sore, no bm for a week No fever, no n/v, no sob, no cough  Assessment/Plan: Principal Problem:   Mucositis due to radiation therapy Active Problems:   Hyperlipidemia   Gout   Essential hypertension   Chronic renal insufficiency   Acute on chronic kidney failure (HCC)  Severe Mucositis with ulceration, erythema/dyaphagia:  He presented with with chills and  Fever (101), significant pain, not able to tolerate po intake. wbc wnl,  cxr unremarkable, ua no infection.  On unasyn, diflucan, magic mouth wash, prn analgesics IR consulted for peg placement , however anatomy unfavorable, general surgery consulted.   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 Corliss Skains of [place] in 06/2016 He is half way through XRT   Cancer pain: continue home regimen with oxycotin and prn oxycodone  AKI on CKDIII, (sees Dr. Neta Ehlers nephrologist in [facility], [place].) Cr baseline 1.9, cr 2.8 on admission. ua no infection, aki likely from dehydration, on ivf , hold lisinopril.   Constipation: stool regiment  Morbid Obesity: Body mass index is 43.14 kg/m.     Code Status: full  Family Communication: patient   Disposition Plan: remain in the hospital   Consultants:  IR  General surgery  Procedures:  none  Antibiotics:  unasyn   Objective: BP (!) 132/48 (BP Location: Right Arm)   Pulse 64   Temp 98.1 F (36.7 C) (Axillary)   Resp 16   Ht 5\' 11"  (1.803 m)   Wt (!) 140.3 kg (309 lb 4.9 oz)   SpO2 95%   BMI 43.14 kg/m   Intake/Output Summary (Last 24 hours) at 09/26/16 0943 Last data filed at 09/26/16 0700  Gross per 24 hour  Intake             1600 ml    Output                0 ml  Net             1600 ml   Filed Weights   09/25/16 1538  Weight: (!) 140.3 kg (309 lb 4.9 oz)    Exam:   General:  Does not look comfortable due to mouth sore, significant mucositis with ulceration, erythema  Cardiovascular: RRR  Respiratory: CTABL  Abdomen: Soft/ND/NT, positive BS  Musculoskeletal: No Edema  Neuro: aaox3  Data Reviewed: Basic Metabolic Panel:  Recent Labs Lab 09/23/16 0858 09/25/16 0939 09/26/16 0353  NA 131* 132* 135  K 4.4 4.7 4.4  CL  --   --  102  CO2 24 22 23   GLUCOSE 90 102 122*  BUN 36.1* 65.1* 53*  CREATININE 1.9* 2.8* 1.88*  CALCIUM 9.5 9.8 9.0  MG  --  2.2  --    Liver Function Tests:  Recent Labs Lab 09/25/16 0939  AST 13  ALT 10  ALKPHOS 36*  BILITOT 0.73  PROT 7.1  ALBUMIN 3.4*   No results for input(s): LIPASE, AMYLASE in the last 168 hours. No results for input(s): AMMONIA in the last 168 hours. CBC:  Recent Labs Lab 09/25/16 0939 09/26/16 0353  WBC 4.8 4.0  NEUTROABS 3.1  --   HGB 9.3* 8.7*  HCT 27.8* 26.1*  MCV  88.5 89.1  PLT 184 206   Cardiac Enzymes:   No results for input(s): CKTOTAL, CKMB, CKMBINDEX, TROPONINI in the last 168 hours. BNP (last 3 results) No results for input(s): BNP in the last 8760 hours.  ProBNP (last 3 results) No results for input(s): PROBNP in the last 8760 hours.  CBG: No results for input(s): GLUCAP in the last 168 hours.  No results found for this or any previous visit (from the past 240 hour(s)).   Studies: Ct Abdomen Wo Contrast  Result Date: 09/26/2016 CLINICAL DATA:  Evaluate for G-tube placement. EXAM: CT ABDOMEN WITHOUT CONTRAST TECHNIQUE: Multidetector CT imaging of the abdomen was performed following the standard protocol without IV contrast. COMPARISON:  None. FINDINGS: Lower chest: Heart is borderline in size. Linear atelectasis or scarring in the lung bases. No effusions. Hepatobiliary: No visible focal hepatic abnormality.  High-density layering material in the gallbladder could reflect small layering stones. Pancreas: No focal abnormality or ductal dilatation. Spleen: No focal abnormality.  Normal size. Adrenals/Urinary Tract: Bilateral perinephric stranding. No hydronephrosis. Low-density areas in the kidneys bilaterally likely reflects cysts. Adrenal glands unremarkable. Stomach/Bowel: Stomach, visualized large and small bowel decompressed and unremarkable. Vascular/Lymphatic: Aortic and proximal common iliac calcifications. No aneurysm. No adenopathy. Other: No free fluid or free air. Musculoskeletal: No acute bony abnormality or focal bone lesion. IMPRESSION: Mild bilateral perinephric stranding. No hydronephrosis. This likely is related to of prior insults. Bilateral renal cysts. No acute findings in the abdomen or pelvis. Aortoiliac atherosclerosis. Electronically Signed   By: Rolm Baptise M.D.   On: 09/26/2016 09:21    Scheduled Meds: . allopurinol  300 mg Oral Daily  . ALPRAZolam  0.5 mg Oral Daily  . ampicillin-sulbactam (UNASYN) IV  3 g Intravenous Q8H  . atorvastatin  40 mg Oral Daily  . citalopram  20 mg Oral Daily  . enoxaparin (LOVENOX) injection  30 mg Subcutaneous Q24H  . fenofibrate  160 mg Oral QAC breakfast  . [START ON 09/27/2016] fluconazole (DIFLUCAN) IV  100 mg Intravenous Q24H  . fluconazole (DIFLUCAN) IV  200 mg Intravenous Once  . oxyCODONE  10 mg Oral Q12H  . polyethylene glycol  17 g Oral Daily  . senna-docusate  1 tablet Oral BID  . [START ON 09/28/2016] Vitamin D (Ergocalciferol)  50,000 Units Oral Q Mon    Continuous Infusions: . dextrose 5 % and 0.9% NaCl 100 mL/hr at 09/26/16 0451     Time spent: 8mins  Amparo Donalson MD, PhD  [facility] Pager [phone]. If 7PM-7AM, please contact night-coverage at [email or web address], password [facility] 09/26/2016, 9:43 AM  LOS: 1 day

## 2016-09-26 NOTE — Progress Notes (Signed)
Pharmacy Antibiotic Note  Randy [name] is a 60 y.o. male with PMH of tongue cancer currently receiving radiation therapy admitted on 09/25/2016 with concern for superinfection of mucositis. Pharmacy has been consulted for Unasyn dosing.  Today, 09/26/2016  Day 1 antibiotics - Renal: SCR improved  Plan: For improved renal function, change to Unasyn 3g IV q6h. Pharmacy will follow at distance.  Height: 5\' 11"  (180.3 cm) Weight: (!) 309 lb 4.9 oz (140.3 kg) IBW/kg (Calculated) : 75.3  Temp (24hrs), Avg:98.9 F (37.2 C), Min:98.1 F (36.7 C), Max:100 F (37.8 C)   Recent Labs Lab 09/23/16 0858 09/25/16 0939 09/26/16 0353  WBC  --  4.8 4.0  CREATININE 1.9* 2.8* 1.88*    Estimated Creatinine Clearance: 59.1 mL/min (by C-G formula based on SCr of 1.88 mg/dL (H)).    No Known Allergies  Antimicrobials this admission: 12/15 >> Unasyn >>  Dose adjustments this admission: --  Microbiology results: None ordered  Thank you for allowing pharmacy to be a part of this patient's care.   Lindell Spar, PharmD, BCPS Pager: [phone] 09/25/2016 4:44 PM

## 2016-09-27 LAB — LIPID PANEL
Cholesterol: 92 mg/dL (ref 0–200)
HDL: 15 mg/dL — ABNORMAL LOW (ref >40)
LDL CALC: 51 mg/dL (ref 0–99)
Total CHOL/HDL Ratio: 6.1 RATIO
Triglycerides: 132 mg/dL (ref <150)
VLDL: 26 mg/dL (ref 0–40)

## 2016-09-27 LAB — BASIC METABOLIC PANEL
Anion gap: 8 (ref 5–15)
BUN: 37 mg/dL — ABNORMAL HIGH (ref 6–20)
CALCIUM: 8.9 mg/dL (ref 8.9–10.3)
CO2: 25 mmol/L (ref 22–32)
Chloride: 105 mmol/L (ref 101–111)
Creatinine, Ser: 1.36 mg/dL — ABNORMAL HIGH (ref 0.61–1.24)
GFR calc non Af Amer: 55 mL/min — ABNORMAL LOW (ref >60)
GLUCOSE: 118 mg/dL — AB (ref 65–99)
Potassium: 4.1 mmol/L (ref 3.5–5.1)
Sodium: 138 mmol/L (ref 135–145)

## 2016-09-27 LAB — CBC WITH DIFFERENTIAL/PLATELET
BASOS PCT: 0 %
Basophils Absolute: 0 10*3/uL (ref 0.0–0.1)
Eosinophils Absolute: 0.1 10*3/uL (ref 0.0–0.7)
Eosinophils Relative: 3 %
HEMATOCRIT: 25.6 % — AB (ref 39.0–52.0)
Hemoglobin: 8.6 g/dL — ABNORMAL LOW (ref 13.0–17.0)
Lymphocytes Relative: 8 %
Lymphs Abs: 0.3 10*3/uL — ABNORMAL LOW (ref 0.7–4.0)
MCH: 30.3 pg (ref 26.0–34.0)
MCHC: 33.6 g/dL (ref 30.0–36.0)
MCV: 90.1 fL (ref 78.0–100.0)
MONO ABS: 0.8 10*3/uL (ref 0.1–1.0)
MONOS PCT: 26 %
NEUTROS ABS: 2 10*3/uL (ref 1.7–7.7)
Neutrophils Relative %: 63 %
Platelets: 191 10*3/uL (ref 150–400)
RBC: 2.84 MIL/uL — ABNORMAL LOW (ref 4.22–5.81)
RDW: 13.6 % (ref 11.5–15.5)
WBC: 3.2 10*3/uL — ABNORMAL LOW (ref 4.0–10.5)

## 2016-09-27 LAB — MAGNESIUM: Magnesium: 2 mg/dL (ref 1.7–2.4)

## 2016-09-27 LAB — URIC ACID: Uric Acid, Serum: 6.2 mg/dL (ref 4.4–7.6)

## 2016-09-27 MED ORDER — DEXTROSE 5 % IV SOLN
2.0000 g | INTRAVENOUS | Status: AC
  Administered 2016-09-28: 2 g via INTRAVENOUS
  Filled 2016-09-27: qty 2

## 2016-09-27 MED ORDER — CHLORHEXIDINE GLUCONATE CLOTH 2 % EX PADS
6.0000 | MEDICATED_PAD | Freq: Once | CUTANEOUS | Status: AC
  Administered 2016-09-28: 6 via TOPICAL

## 2016-09-27 MED ORDER — MINERAL OIL RE ENEM
1.0000 | ENEMA | Freq: Once | RECTAL | Status: AC
  Administered 2016-09-27: 1 via RECTAL
  Filled 2016-09-27: qty 1

## 2016-09-27 MED ORDER — SORBITOL 70 % SOLN
960.0000 mL | TOPICAL_OIL | Freq: Once | ORAL | Status: AC
  Administered 2016-09-27: 960 mL via RECTAL
  Filled 2016-09-27: qty 240

## 2016-09-27 NOTE — Progress Notes (Signed)
Mineral oil enema given to patient with no results. Dr Erlinda Hong notified. Will keep monitoring.

## 2016-09-27 NOTE — Progress Notes (Signed)
PROGRESS NOTE  Randy [name] [id number] DOB: 1956-07-26 DOA: 09/25/2016 PCP: Kennon Holter, MD  HPI/Recap of past 24 hours:  Persistent pain in mouth, no bm for a week Fever resolved, no n/v, no sob, no cough,   Assessment/Plan: Principal Problem:   Mucositis due to radiation therapy Active Problems:   Hyperlipidemia   Gout   Obesity   Essential hypertension   Cancer of dorsal tongue (HCC)   Chronic renal insufficiency   Acute on chronic kidney failure (HCC)   Chronic kidney disease, stage 3   Dysphagia, oropharyngeal - severe  Severe Mucositis with ulceration, erythema/dyaphagia:  He presented with with chills and  Fever (101), significant pain, not able to tolerate po intake. wbc wnl,  cxr unremarkable, ua no infection.  On unasyn, diflucan, magic mouth wash, prn analgesics IR consulted for peg placement , however anatomy unfavorable, general surgery consulted. Plan to have feeding tube placed in OR on Monday. Will follow surgery recommendations.  AKI on CKDIII, (sees Dr. Neta Ehlers nephrologist in [facility], [place].) Cr baseline 1.9, cr 2.8 on admission. 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 Corliss Skains of [facility] in 06/2016 He is half way through XRT   Cancer pain: continue home regimen with oxycotin and prn oxycodone   Morbid Obesity: Body mass index is 43.14 kg/m.     Code Status: full  Family Communication: patient and wife in room  Disposition Plan: remain in the hospital   Consultants:  IR  General  surgery  Procedures:  none  Antibiotics:  unasyn   Objective: BP (!) 127/58 (BP Location: Right Arm)   Pulse 62   Temp 99.2 F (37.3 C) (Oral)   Resp 16   Ht 5\' 11"  (1.803 m)   Wt (!) 140.3 kg (309 lb 4.9 oz)   SpO2 98%   BMI 43.14 kg/m   Intake/Output Summary (Last 24 hours) at 09/27/16 1010 Last data filed at 09/27/16 0532  Gross per 24 hour  Intake             4034 ml  Output              400 ml  Net             3634 ml   Filed Weights   09/25/16 1538  Weight: (!) 140.3 kg (309 lb 4.9 oz)    Exam:   General:  Does not look comfortable due to mouth sore, significant mucositis with ulcerations, erythema  Cardiovascular: RRR  Respiratory: CTABL  Abdomen: Soft/ND/NT, positive BS  Musculoskeletal: No Edema  Neuro: aaox3  Data Reviewed: Basic Metabolic Panel:  Recent Labs Lab 09/23/16 0858 09/25/16 0939 09/26/16 0353 09/27/16 0346  NA 131* 132* 135 138  K 4.4 4.7 4.4 4.1  CL  --   --  102 105  CO2 24 22 23 25   GLUCOSE 90 102 122* 118*  BUN 36.1* 65.1* 53* 37*  CREATININE 1.9* 2.8* 1.88* 1.36*  CALCIUM 9.5 9.8 9.0 8.9  MG  --  2.2  --  2.0   Liver Function Tests:  Recent Labs Lab 09/25/16  0939  AST 13  ALT 10  ALKPHOS 36*  BILITOT 0.73  PROT 7.1  ALBUMIN 3.4*   No results for input(s): LIPASE, AMYLASE in the last 168 hours. No results for input(s): AMMONIA in the last 168 hours. CBC:  Recent Labs Lab 09/25/16 0939 09/26/16 0353 09/27/16 0346  WBC 4.8 4.0 3.2*  NEUTROABS 3.1  --  2.0  HGB 9.3* 8.7* 8.6*  HCT 27.8* 26.1* 25.6*  MCV 88.5 89.1 90.1  PLT 184 206 191   Cardiac Enzymes:   No results for input(s): CKTOTAL, CKMB, CKMBINDEX, TROPONINI in the last 168 hours. BNP (last 3 results) No results for input(s): BNP in the last 8760 hours.  ProBNP (last 3 results) No results for input(s): PROBNP in the last 8760 hours.  CBG: No results for input(s): GLUCAP in the last 168 hours.  Recent Results (from the past 240  hour(s))  Urine Culture     Status: None   Collection Time: 09/25/16  1:19 PM  Result Value Ref Range Status   Urine Culture, Routine Final report  Final   Urine Culture result 1 No growth  Final  Culture, Blood     Status: None (Preliminary result)   Collection Time: 09/25/16  1:21 PM  Result Value Ref Range Status   BLOOD CULTURE, ROUTINE Preliminary report  Preliminary   RESULT 1 Comment  Preliminary    Comment: No growth detected at this time.  Culture, Blood     Status: None (Preliminary result)   Collection Time: 09/25/16  1:22 PM  Result Value Ref Range Status   BLOOD CULTURE, ROUTINE Preliminary report  Preliminary   RESULT 1 Comment  Preliminary    Comment: No growth detected at this time.     Studies: Dg Chest 1 View  Result Date: 09/26/2016 CLINICAL DATA:  Fever EXAM: CHEST 1 VIEW COMPARISON:  None. FINDINGS: Cardiomegaly. No confluent airspace opacities, effusions or edema. No acute bony abnormality. IMPRESSION: Cardiomegaly.  No active disease. Electronically Signed   By: Rolm Baptise M.D.   On: 09/26/2016 14:45    Scheduled Meds: . allopurinol  300 mg Oral Daily  . ALPRAZolam  0.5 mg Oral Daily  . ampicillin-sulbactam (UNASYN) IV  3 g Intravenous Q6H  . atorvastatin  40 mg Oral Daily  . [START ON 09/28/2016] cefoTEtan (CEFOTAN) IV  2 g Intravenous On Call to OR  . Chlorhexidine Gluconate Cloth  6 each Topical Once  . [START ON 09/28/2016] Chlorhexidine Gluconate Cloth  6 each Topical Once  . citalopram  20 mg Oral Daily  . enoxaparin (LOVENOX) injection  40 mg Subcutaneous Q24H  . fenofibrate  160 mg Oral QAC breakfast  . fluconazole (DIFLUCAN) IV  100 mg Intravenous Q24H  . lip balm  1 application Topical BID  . oxyCODONE  10 mg Oral Q12H  . polyethylene glycol  17 g Oral Daily  . senna-docusate  1 tablet Oral BID  . [START ON 09/28/2016] Vitamin D (Ergocalciferol)  50,000 Units Oral Q Mon    Continuous Infusions: . dextrose 5 % and 0.9% NaCl 100 mL/hr at  09/27/16 0550     Time spent: 68mins  Olanda Downie MD, PhD  [facility] Pager [phone]. If 7PM-7AM, please contact night-coverage at [email or web address], password [facility] 09/27/2016, 10:10 AM  LOS: 2 days

## 2016-09-27 NOTE — Progress Notes (Signed)
Initial Nutrition Assessment  DOCUMENTATION CODES:   Severe malnutrition in context of chronic illness, Morbid obesity  INTERVENTION:   Monitor magnesium, potassium, and phosphorus daily for at least 3 days, MD to replete as needed, as pt is at risk for refeeding syndrome given severe malnutrition and poor PO intake for >1 week.  If RD to manage tube feeding, please consult.  Tube Feeding recommendations: Initiate Osmolite 1.5  @ 20 ml/hr via PEG and increase by 10 ml every 12 hours to goal rate of 70 ml/hr.  30 ml Prostat BID.    Tube feeding regimen provides 2720 kcal (100% of needs), 135 grams of protein, and 1280 ml of H2O.   RD to continue to follow for plan.  NUTRITION DIAGNOSIS:   Malnutrition related to dysphagia, mouth pain, chronic illness as evidenced by percent weight loss, energy intake < 75% for > or equal to 1 month.  GOAL:   Patient will meet greater than or equal to 90% of their needs  MONITOR:   PO intake, Labs, Weight trends, I & O's  REASON FOR ASSESSMENT:   Malnutrition Screening Tool    ASSESSMENT:   60 y.o. male with medical history significant of tongue cancer, anxiety, CKD, mucositis, hyperlipidemia, hypertension, gout. Symptoms started about two weeks ago with oral pain secondary to radioation therapy. Initially pain was controlled with extra strength Tylenol, but started to worsen last weekend. He was seen this week and started on oxycodone and Oxycontin for pain, which have not helped much. He has been using Mugard, which has helped. He reports developing a fever last night to 101 degrees farenheit. He was seen today for follow-up and recommended for admission to treat severe mucositis with superinfection in addition to significant dehydration.  Patient in room with wife at bedside. Pt sleeping, all history from pt's wife. Pt has not been eating well d/t dysphagia and worsening mucositis. Pt has been subsisting on Boost High Protein, broths and  puddings. Pt's wife states he was averaging 2-3 Boosts a day with a little pudding daily this past week (provides 480-720 kcal, 30-45g protein daily). Pt followed by [place] RD who recommended PEG placement for patient d/t poor nutrition status and inability to eat and meet estimated needs.  Per surgery note, IR unable to place tube so surgery will place tube openly on Monday 12/18.   UBW is 337 lb. Pt has lost 28 lb since September (8% wt loss x 3 months, significant for time frame). Nutrition focused physical exam shows no sign of depletion of muscle mass or body fat.  Labs reviewed. Medications: Miralax packet daily, Senokot-S tablet BID, D5- .9% NaCl infusion at 100 ml/hr -provides 408 kcal   Diet Order:  Diet NPO time specified  Skin:  Reviewed, no issues  Last BM:  PTA  Height:   Ht Readings from Last 1 Encounters:  09/25/16 5\' 11"  (1.803 m)    Weight:   Wt Readings from Last 1 Encounters:  09/25/16 (!) 309 lb 4.9 oz (140.3 kg)    Ideal Body Weight:  78.2 kg  BMI:  Body mass index is 43.14 kg/m.  Estimated Nutritional Needs:   Kcal:  2600-2800  Protein:  130-150g  Fluid:  2.8L/day  EDUCATION NEEDS:   Education needs addressed  Clayton Bibles, MS, RD, LDN Pager: [phone] After Hours Pager: [phone]

## 2016-09-28 ENCOUNTER — Encounter (HOSPITAL_COMMUNITY): Payer: Self-pay | Admitting: Certified Registered"

## 2016-09-28 ENCOUNTER — Inpatient Hospital Stay (HOSPITAL_COMMUNITY): Payer: Managed Care, Other (non HMO) | Admitting: Certified Registered"

## 2016-09-28 ENCOUNTER — Ambulatory Visit: Payer: Self-pay | Admitting: Nurse Practitioner

## 2016-09-28 ENCOUNTER — Encounter (HOSPITAL_COMMUNITY)
Admission: AD | Disposition: A | Discharge status: Home / Self Care | Payer: Self-pay | Source: Ambulatory Visit | Attending: Internal Medicine

## 2016-09-28 ENCOUNTER — Ambulatory Visit: Admission: RE | Admit: 2016-09-28 | Payer: Managed Care, Other (non HMO) | Source: Ambulatory Visit

## 2016-09-28 ENCOUNTER — Encounter: Payer: Self-pay | Admitting: Radiation Oncology

## 2016-09-28 ENCOUNTER — Encounter: Payer: Self-pay | Admitting: *Deleted

## 2016-09-28 ENCOUNTER — Telehealth: Payer: Self-pay | Admitting: *Deleted

## 2016-09-28 HISTORY — PX: GASTROSTOMY: SHX5249

## 2016-09-28 LAB — CBC WITH DIFFERENTIAL/PLATELET
BASOS ABS: 0 10*3/uL (ref 0.0–0.1)
Basophils Relative: 0 %
Eosinophils Absolute: 0.1 10*3/uL (ref 0.0–0.7)
Eosinophils Relative: 1 %
HEMATOCRIT: 25.8 % — AB (ref 39.0–52.0)
Hemoglobin: 8.4 g/dL — ABNORMAL LOW (ref 13.0–17.0)
LYMPHS ABS: 0.4 10*3/uL — AB (ref 0.7–4.0)
LYMPHS PCT: 11 %
MCH: 29.2 pg (ref 26.0–34.0)
MCHC: 32.6 g/dL (ref 30.0–36.0)
MCV: 89.6 fL (ref 78.0–100.0)
MONO ABS: 0.8 10*3/uL (ref 0.1–1.0)
MONOS PCT: 20 %
NEUTROS ABS: 2.7 10*3/uL (ref 1.7–7.7)
Neutrophils Relative %: 68 %
Platelets: 209 10*3/uL (ref 150–400)
RBC: 2.88 MIL/uL — ABNORMAL LOW (ref 4.22–5.81)
RDW: 13.6 % (ref 11.5–15.5)
WBC: 4 10*3/uL (ref 4.0–10.5)

## 2016-09-28 LAB — BASIC METABOLIC PANEL
ANION GAP: 8 (ref 5–15)
BUN: 29 mg/dL — AB (ref 6–20)
CALCIUM: 9 mg/dL (ref 8.9–10.3)
CO2: 26 mmol/L (ref 22–32)
Chloride: 105 mmol/L (ref 101–111)
Creatinine, Ser: 1.43 mg/dL — ABNORMAL HIGH (ref 0.61–1.24)
GFR calc Af Amer: 60 mL/min — ABNORMAL LOW (ref >60)
GFR calc non Af Amer: 51 mL/min — ABNORMAL LOW (ref >60)
GLUCOSE: 126 mg/dL — AB (ref 65–99)
Potassium: 4 mmol/L (ref 3.5–5.1)
Sodium: 139 mmol/L (ref 135–145)

## 2016-09-28 LAB — SURGICAL PCR SCREEN
MRSA, PCR: NEGATIVE
STAPHYLOCOCCUS AUREUS: NEGATIVE

## 2016-09-28 LAB — HIV ANTIBODY (ROUTINE TESTING W REFLEX): HIV Screen 4th Generation wRfx: NONREACTIVE

## 2016-09-28 SURGERY — INSERTION OF GASTROSTOMY TUBE
Anesthesia: General

## 2016-09-28 MED ORDER — SODIUM CHLORIDE 0.9 % IR SOLN
Status: DC | PRN
  Administered 2016-09-28: 1

## 2016-09-28 MED ORDER — PROPOFOL 10 MG/ML IV BOLUS
INTRAVENOUS | Status: AC
  Filled 2016-09-28: qty 20

## 2016-09-28 MED ORDER — MORPHINE SULFATE (PF) 4 MG/ML IV SOLN
4.0000 mg | INTRAVENOUS | Status: DC | PRN
  Administered 2016-09-28 – 2016-10-02 (×28): 4 mg via INTRAVENOUS
  Filled 2016-09-28 (×28): qty 1

## 2016-09-28 MED ORDER — SODIUM CHLORIDE 0.9 % IV SOLN
INTRAVENOUS | Status: DC
  Administered 2016-09-28 (×2): via INTRAVENOUS

## 2016-09-28 MED ORDER — PHENYLEPHRINE 40 MCG/ML (10ML) SYRINGE FOR IV PUSH (FOR BLOOD PRESSURE SUPPORT)
PREFILLED_SYRINGE | INTRAVENOUS | Status: AC
Start: 2016-09-28 — End: 2016-09-28
  Filled 2016-09-28: qty 10

## 2016-09-28 MED ORDER — OXYCODONE HCL 5 MG PO TABS
5.0000 mg | ORAL_TABLET | ORAL | Status: DC | PRN
  Administered 2016-09-30 – 2016-10-01 (×3): 5 mg via ORAL
  Filled 2016-09-28 (×3): qty 1

## 2016-09-28 MED ORDER — BUPIVACAINE HCL (PF) 0.5 % IJ SOLN
INTRAMUSCULAR | Status: AC
  Filled 2016-09-28: qty 30

## 2016-09-28 MED ORDER — BUPIVACAINE HCL 0.5 % IJ SOLN
INTRAMUSCULAR | Status: DC | PRN
  Administered 2016-09-28: 10 mL

## 2016-09-28 MED ORDER — ONDANSETRON HCL 4 MG/2ML IJ SOLN
INTRAMUSCULAR | Status: AC
  Filled 2016-09-28: qty 2

## 2016-09-28 MED ORDER — LIDOCAINE 2% (20 MG/ML) 5 ML SYRINGE
INTRAMUSCULAR | Status: AC
Start: 2016-09-28 — End: 2016-09-28
  Filled 2016-09-28: qty 5

## 2016-09-28 MED ORDER — SUGAMMADEX SODIUM 500 MG/5ML IV SOLN
INTRAVENOUS | Status: AC
  Filled 2016-09-28: qty 5

## 2016-09-28 MED ORDER — SALINE SPRAY 0.65 % NA SOLN
1.0000 | NASAL | Status: DC | PRN
  Administered 2016-09-28 (×2): 1 via NASAL
  Filled 2016-09-28: qty 44

## 2016-09-28 MED ORDER — SUCCINYLCHOLINE CHLORIDE 200 MG/10ML IV SOSY
PREFILLED_SYRINGE | INTRAVENOUS | Status: AC
  Filled 2016-09-28: qty 10

## 2016-09-28 MED ORDER — PROPOFOL 10 MG/ML IV BOLUS
INTRAVENOUS | Status: DC | PRN
  Administered 2016-09-28: 150 mg via INTRAVENOUS

## 2016-09-28 MED ORDER — LACTATED RINGERS IV SOLN: INTRAVENOUS | Status: DC | PRN

## 2016-09-28 MED ORDER — FENTANYL CITRATE (PF) 250 MCG/5ML IJ SOLN
INTRAMUSCULAR | Status: AC
  Filled 2016-09-28: qty 5

## 2016-09-28 MED ORDER — ONDANSETRON HCL 4 MG/2ML IJ SOLN
INTRAMUSCULAR | Status: DC | PRN
  Administered 2016-09-28: 4 mg via INTRAVENOUS

## 2016-09-28 MED ORDER — FENTANYL CITRATE (PF) 100 MCG/2ML IJ SOLN
25.0000 ug | INTRAMUSCULAR | Status: DC | PRN
  Administered 2016-09-28 (×3): 50 ug via INTRAVENOUS

## 2016-09-28 MED ORDER — FENTANYL CITRATE (PF) 100 MCG/2ML IJ SOLN
INTRAMUSCULAR | Status: AC
  Administered 2016-09-28: 14:00:00
  Filled 2016-09-28: qty 2

## 2016-09-28 MED ORDER — SODIUM CHLORIDE 0.9 % IV SOLN: INTRAVENOUS | Status: DC

## 2016-09-28 MED ORDER — LIDOCAINE 2% (20 MG/ML) 5 ML SYRINGE
INTRAMUSCULAR | Status: DC | PRN
  Administered 2016-09-28: 100 mg via INTRAVENOUS

## 2016-09-28 MED ORDER — CHLORHEXIDINE GLUCONATE CLOTH 2 % EX PADS
6.0000 | MEDICATED_PAD | Freq: Once | CUTANEOUS | Status: AC
  Administered 2016-09-28: 6 via TOPICAL

## 2016-09-28 MED ORDER — FENTANYL CITRATE (PF) 100 MCG/2ML IJ SOLN
INTRAMUSCULAR | Status: AC
  Administered 2016-09-28: 15:00:00
  Filled 2016-09-28: qty 2

## 2016-09-28 MED ORDER — FENTANYL CITRATE (PF) 250 MCG/5ML IJ SOLN
INTRAMUSCULAR | Status: DC | PRN
  Administered 2016-09-28: 25 ug via INTRAVENOUS
  Administered 2016-09-28: 50 ug via INTRAVENOUS

## 2016-09-28 MED ORDER — EPHEDRINE 5 MG/ML INJ
INTRAVENOUS | Status: AC
  Filled 2016-09-28: qty 10

## 2016-09-28 MED ORDER — SUCCINYLCHOLINE CHLORIDE 200 MG/10ML IV SOSY
PREFILLED_SYRINGE | INTRAVENOUS | Status: DC | PRN
  Administered 2016-09-28: 100 mg via INTRAVENOUS

## 2016-09-28 MED ORDER — MIDAZOLAM HCL 2 MG/2ML IJ SOLN
INTRAMUSCULAR | Status: AC
  Filled 2016-09-28: qty 2

## 2016-09-28 MED ORDER — PROMETHAZINE HCL 25 MG/ML IJ SOLN: 6.2500 mg | INTRAMUSCULAR | Status: DC | PRN

## 2016-09-28 SURGICAL SUPPLY — 39 items
APPLICATOR COTTON TIP 6IN STRL (MISCELLANEOUS) ×2 IMPLANT
BLADE EXTENDED COATED 6.5IN (ELECTRODE) IMPLANT
BLADE HEX COATED 2.75 (ELECTRODE) ×2 IMPLANT
CATH DRAINAGE MALECOT 26FR (CATHETERS) IMPLANT
CATH MALECOT (CATHETERS) ×2
COVER MAYO STAND STRL (DRAPES) IMPLANT
COVER SURGICAL LIGHT HANDLE (MISCELLANEOUS) ×2 IMPLANT
DRAPE LAPAROSCOPIC ABDOMINAL (DRAPES) ×2 IMPLANT
DRAPE WARM FLUID 44X44 (DRAPE) IMPLANT
DRSG OPSITE POSTOP 4X6 (GAUZE/BANDAGES/DRESSINGS) ×1 IMPLANT
ELECT REM PT RETURN 9FT ADLT (ELECTROSURGICAL) ×2
ELECTRODE REM PT RTRN 9FT ADLT (ELECTROSURGICAL) ×1 IMPLANT
GAUZE SPONGE 4X4 12PLY STRL (GAUZE/BANDAGES/DRESSINGS) ×2 IMPLANT
GLOVE BIO SURGEON STRL SZ7.5 (GLOVE) ×4 IMPLANT
GLOVE BIOGEL PI IND STRL 7.0 (GLOVE) ×1 IMPLANT
GLOVE BIOGEL PI INDICATOR 7.0 (GLOVE) ×1
GOWN STRL REUS W/ TWL XL LVL3 (GOWN DISPOSABLE) ×1 IMPLANT
GOWN STRL REUS W/TWL LRG LVL3 (GOWN DISPOSABLE) ×2 IMPLANT
GOWN STRL REUS W/TWL XL LVL3 (GOWN DISPOSABLE) ×4 IMPLANT
HANDLE SUCTION POOLE (INSTRUMENTS) IMPLANT
KIT BASIN OR (CUSTOM PROCEDURE TRAY) ×2 IMPLANT
NS IRRIG 1000ML POUR BTL (IV SOLUTION) ×2 IMPLANT
PACK GENERAL/GYN (CUSTOM PROCEDURE TRAY) ×2 IMPLANT
SPONGE LAP 18X18 X RAY DECT (DISPOSABLE) IMPLANT
STAPLER VISISTAT 35W (STAPLE) ×2 IMPLANT
SUCTION POOLE HANDLE (INSTRUMENTS)
SUT ETHILON 3 0 PS 1 (SUTURE) ×1 IMPLANT
SUT PDS AB 1 CTX 36 (SUTURE) IMPLANT
SUT PDS AB 1 TP1 96 (SUTURE) ×2 IMPLANT
SUT SILK 2 0 (SUTURE)
SUT SILK 2 0 SH CR/8 (SUTURE) IMPLANT
SUT SILK 2-0 18XBRD TIE 12 (SUTURE) IMPLANT
SUT SILK 3 0 (SUTURE)
SUT SILK 3 0 SH CR/8 (SUTURE) IMPLANT
SUT SILK 3-0 18XBRD TIE 12 (SUTURE) IMPLANT
TOWEL OR 17X26 10 PK STRL BLUE (TOWEL DISPOSABLE) ×4 IMPLANT
TRAY FOLEY W/METER SILVER 16FR (SET/KITS/TRAYS/PACK) IMPLANT
WATER STERILE IRR 1500ML POUR (IV SOLUTION) ×2 IMPLANT
YANKAUER SUCT BULB TIP NO VENT (SUCTIONS) IMPLANT

## 2016-09-28 NOTE — Anesthesia Postprocedure Evaluation (Signed)
Anesthesia Post Note  Patient: Randy [name]  Procedure(s) Performed: Procedure(s) (LRB): OPEN INSERTION OF GASTROSTOMY TUBE (N/A)  Patient location during evaluation: PACU Anesthesia Type: General Level of consciousness: awake and alert Pain management: pain level controlled Vital Signs Assessment: post-procedure vital signs reviewed and stable Respiratory status: spontaneous breathing, nonlabored ventilation, respiratory function stable and patient connected to nasal cannula oxygen Cardiovascular status: blood pressure returned to baseline and stable Postop Assessment: no signs of nausea or vomiting Anesthetic complications: no       Last Vitals:  Vitals:   09/28/16 1422 09/28/16 1426  BP: 130/64   Pulse: 69 61  Resp: 14 12  Temp:      Last Pain:  Vitals:   09/28/16 1100  TempSrc:   PainSc: 4                  [name] S

## 2016-09-28 NOTE — Transfer of Care (Signed)
Immediate Anesthesia Transfer of Care Note  Patient: Randy [name]  Procedure(s) Performed: Procedure(s): OPEN INSERTION OF GASTROSTOMY TUBE (N/A)  Patient Location: PACU  Anesthesia Type:General  Level of Consciousness: sedated  Airway & Oxygen Therapy: Patient Spontanous Breathing and Patient connected to face mask oxygen  Post-op Assessment: Report given to RN and Post -op Vital signs reviewed and stable  Post vital signs: Reviewed and stable  Last Vitals:  Vitals:   09/27/16 2121 09/28/16 0457  BP: 131/64 (!) 130/58  Pulse: 69 63  Resp: 16 16  Temp: 37.6 C 37.1 C    Last Pain:  Vitals:   09/28/16 1100  TempSrc:   PainSc: 4       Patients Stated Pain Goal: 5 ([id number])  Complications: No apparent anesthesia complications

## 2016-09-28 NOTE — Anesthesia Procedure Notes (Signed)
Procedure Name: Intubation Date/Time: 09/28/2016 12:27 PM Performed by: Cynda Familia Pre-anesthesia Checklist: Patient identified, Emergency Drugs available, Suction available and Patient being monitored Patient Re-evaluated:Patient Re-evaluated prior to inductionOxygen Delivery Method: Circle System Utilized Preoxygenation: Pre-oxygenation with 100% oxygen Intubation Type: IV induction, Cricoid Pressure applied and Rapid sequence Ventilation: Mask ventilation without difficulty Grade View: Grade I Tube type: Oral Number of attempts: 1 Airway Equipment and Method: Stylet and Video-laryngoscopy Placement Confirmation: ETT inserted through vocal cords under direct vision,  positive ETCO2 and breath sounds checked- equal and bilateral Secured at: 23 cm Tube secured with: Tape Dental Injury: Teeth and Oropharynx as per pre-operative assessment  Comments: Smooth IV induction--- Rose--- intubation Glidescope--- good view--- very friable tissues--- discussed possible post op ventilation   With Ilsa Iha--- bilat BS Rose--- teeth and mouth as preop

## 2016-09-28 NOTE — Progress Notes (Signed)
  Subjective: No complaints  Objective: Vital signs in last 24 hours: Temp:  [98.6 F (37 C)-99.6 F (37.6 C)] 98.7 F (37.1 C) (12/18 0457) Pulse Rate:  [63-69] 63 (12/18 0457) Resp:  [16] 16 (12/18 0457) BP: (128-131)/(55-64) 130/58 (12/18 0457) SpO2:  [96 %-100 %] 100 % (12/18 0457) Last BM Date:  (per pt a week ago,laxative, supp and stool softner given)  Intake/Output from previous day: 12/17 0701 - 12/18 0700 In: 4595 [P.O.:240; I.V.:3605; IV Piggyback:750] Out: 1200 [Urine:1200] Intake/Output this shift: No intake/output data recorded.  Resp: clear to auscultation bilaterally Cardio: regular rate and rhythm GI: soft, non-tender; bowel sounds normal; no masses,  no organomegaly  Lab Results:   Recent Labs  09/27/16 0346 09/28/16 0451  WBC 3.2* 4.0  HGB 8.6* 8.4*  HCT 25.6* 25.8*  PLT 191 209   BMET  Recent Labs  09/27/16 0346 09/28/16 0451  NA 138 139  K 4.1 4.0  CL 105 105  CO2 25 26  GLUCOSE 118* 126*  BUN 37* 29*  CREATININE 1.36* 1.43*  CALCIUM 8.9 9.0   PT/INR No results for input(s): LABPROT, INR in the last 72 hours. ABG No results for input(s): PHART, HCO3 in the last 72 hours.  Invalid input(s): PCO2, PO2  Studies/Results: Dg Chest 1 View  Result Date: 09/26/2016 CLINICAL DATA:  Fever EXAM: CHEST 1 VIEW COMPARISON:  None. FINDINGS: Cardiomegaly. No confluent airspace opacities, effusions or edema. No acute bony abnormality. IMPRESSION: Cardiomegaly.  No active disease. Electronically Signed   By: Rolm Baptise M.D.   On: 09/26/2016 14:45    Anti-infectives: Anti-infectives    Start     Dose/Rate Route Frequency Ordered Stop   09/28/16 0600  cefoTEtan (CEFOTAN) 2 g in dextrose 5 % 50 mL IVPB     2 g 100 mL/hr over 30 Minutes Intravenous On call to O.R. 09/27/16 0332 09/29/16 0559   09/27/16 1000  fluconazole (DIFLUCAN) IVPB 100 mg     100 mg 50 mL/hr over 60 Minutes Intravenous Every 24 hours 09/26/16 0942 10/01/16 0959   09/27/16 0600  cefoTEtan (CEFOTAN) 2 g in dextrose 5 % 50 mL IVPB  Status:  Discontinued     2 g 100 mL/hr over 30 Minutes Intravenous On call to O.R. 09/26/16 1417 09/27/16 0332   09/26/16 1400  Ampicillin-Sulbactam (UNASYN) 3 g in sodium chloride 0.9 % 100 mL IVPB     3 g 200 mL/hr over 30 Minutes Intravenous Every 6 hours 09/26/16 1020     09/26/16 1000  fluconazole (DIFLUCAN) IVPB 200 mg     200 mg 100 mL/hr over 60 Minutes Intravenous  Once 09/26/16 0942 09/26/16 1115   09/26/16 0000  Ampicillin-Sulbactam (UNASYN) 3 g in sodium chloride 0.9 % 100 mL IVPB  Status:  Discontinued     3 g 200 mL/hr over 30 Minutes Intravenous Every 8 hours 09/25/16 1643 09/26/16 1020   09/25/16 1630  Ampicillin-Sulbactam (UNASYN) 3 g in sodium chloride 0.9 % 100 mL IVPB     3 g 200 mL/hr over 30 Minutes Intravenous STAT 09/25/16 1609 09/25/16 1713      Assessment/Plan: s/p Procedure(s): OPEN INSERTION OF GASTROSTOMY TUBE (N/A) Plan for gastrostomy tube today. Risks and benefits of the surgery as well as some of the technical aspects discussed with the patient and he understands and wishes to proceed  LOS: 3 days    TOTH III,Shanaya Schneck S 09/28/2016

## 2016-09-28 NOTE — Progress Notes (Signed)
Patient unable to have BM for several days. SMOG fleet  ordered by MD administered with results of formed brown stool. Patient tolerated procedure well.

## 2016-09-28 NOTE — Progress Notes (Addendum)
Oncology Nurse Navigator Documentation  Visited Mr. Nim upon his return from PACU s/p gastrostomy tube placement to check on his well-being.   He stated he did want to proceed with further RT until he had an opportunity to speak with Dr. Tammi Klippel.  I indicated I would relay message. I notified Tomo RTTs and transporter Scott of today's appt cancellation.  Gayleen Orem, RN, BSN, [place] [facility] Nurse [place] [facility] [place] [phone]

## 2016-09-28 NOTE — Progress Notes (Signed)
Nutrition Brief Follow-up  Pt currently in OR for PEG placement.  Once tube ready to use: Tube Feeding recommendations: -Initiate Osmolite 1.5  @ 20 ml/hr via PEG and increase by 10 ml every 12 hours to goal rate of 70 ml/hr.  -30 ml Prostat BID.   -Tube feeding regimen provides 2720 kcal (100% of needs), 135 grams of protein, and 1280 ml of H2O.   If RD to manage TF, please consult.   Clayton Bibles, MS, RD, LDN Pager: [phone] After Hours Pager: [phone]

## 2016-09-28 NOTE — Op Note (Signed)
09/25/2016 - 09/28/2016  1:26 PM  PATIENT:  Randy [name]  60 y.o. male  PRE-OPERATIVE DIAGNOSIS:  tongue cancer, need for g-tube  POST-OPERATIVE DIAGNOSIS:  tongue cancer, need for g-tube  PROCEDURE:  Procedure(s): OPEN INSERTION OF GASTROSTOMY TUBE (N/A)  SURGEON:  Surgeon(s) and Role:    * Jovita Kussmaul, MD - Primary  PHYSICIAN ASSISTANT:   ASSISTANTS: none   ANESTHESIA:   local and general  EBL:  Total I/O In: 800 [I.V.:800] Out: 25 [Blood:25]  BLOOD ADMINISTERED:none  DRAINS: Gastrostomy Tube   LOCAL MEDICATIONS USED:  MARCAINE     SPECIMEN:  No Specimen  DISPOSITION OF SPECIMEN:  N/A  COUNTS:  YES  TOURNIQUET:  * No tourniquets in log *  DICTATION: .Dragon Dictation   After informed consent was obtained the patient was brought to the operating room and placed in the supine position on the operating room table. After adequate induction of general anesthesia the patient's abdomen was prepped with ChloraPrep, allowed to dry, and draped in usual sterile manner. An appropriate timeout was performed. A small upper midline incision was made with a 10 blade knife. The incision was carried through the skin and subcutaneous tissue sharply with electrocautery until the linea alba was identified. The linea alba was incised with the electrocautery. The preperitoneal space was probed bluntly with a hemostat until the peritoneum was opened and access was gained to the abdominal cavity. The rest of the incision was opened under direct vision. The stomach was readily identified. The body of the stomach was grasped with an Allis clamp. A 2-0 silk pursestring stitch was placed along the midbody of the stomach. A small opening was made in the middle of the pursestring stitch sharply with the electrocautery. A 28 French Mallinckrodt tube was then placed through this opening into the lumen of the stomach. The tube was then anchored in place with the pursestring stitch. The tube was  then brought through the abdominal wall through a small incision in the left upper quadrant. The stomach was anchored to the abdominal wall around the tube exit site with 2-0 silk stitches. Once this was accomplished the tube appeared to be in good position in the stomach was nicely anchored around the exit site. The abdomen was then irrigated with copious amounts of saline. The gastrostomy tube was anchored to the skin with a 3-0 nylon stitch. The fascia of the abdominal wall was closed with 2 running #1 double-stranded loop PDS sutures. The subcutaneous tissue was irrigated with copious amounts of saline. The skin was closed with staples. The gastrostomy tube was placed to Foley bag drain. Sterile dressings were applied. The patient tolerated the procedure well. At the end of the case all needle sponge counts were correct. The patient was then awakened and taken to recovery in stable condition.  PLAN OF CARE: Admit to inpatient   PATIENT DISPOSITION:  PACU - hemodynamically stable.   Delay start of Pharmacological VTE agent (>24hrs) due to surgical blood loss or risk of bleeding: no

## 2016-09-28 NOTE — Anesthesia Preprocedure Evaluation (Addendum)
Anesthesia Evaluation  Patient identified by MRN, date of birth, ID band Patient awake  General Assessment Comment:H/O tongue CA  Reviewed: Allergy & Precautions, NPO status , Patient's Chart, lab work & pertinent test results  Airway Mallampati: II  TM Distance: >3 FB Neck ROM: Full    Dental no notable dental hx. (+) Dental Advisory Given   Pulmonary neg pulmonary ROS, former smoker,    Pulmonary exam normal breath sounds clear to auscultation       Cardiovascular hypertension, negative cardio ROS Normal cardiovascular exam Rhythm:Regular Rate:Normal     Neuro/Psych negative neurological ROS  negative psych ROS   GI/Hepatic negative GI ROS, Neg liver ROS,   Endo/Other  negative endocrine ROS  Renal/GU Renal InsufficiencyRenal disease  negative genitourinary   Musculoskeletal negative musculoskeletal ROS (+)   Abdominal   Peds negative pediatric ROS (+)  Hematology  (+) anemia ,   Anesthesia Other Findings   Reproductive/Obstetrics negative OB ROS                            Anesthesia Physical Anesthesia Plan  ASA: III  Anesthesia Plan: General   Post-op Pain Management:    Induction: Intravenous  Airway Management Planned: Oral ETT and Video Laryngoscope Planned  Additional Equipment:   Intra-op Plan:   Post-operative Plan: Extubation in OR  Informed Consent: I have reviewed the patients History and Physical, chart, labs and discussed the procedure including the risks, benefits and alternatives for the proposed anesthesia with the patient or authorized representative who has indicated his/her understanding and acceptance.   Dental advisory given  Plan Discussed with: CRNA and Surgeon  Anesthesia Plan Comments:         Anesthesia Quick Evaluation

## 2016-09-28 NOTE — Telephone Encounter (Signed)
Oncology Nurse Navigator Documentation  Spoke with patient's RN, Legrand Rams, [place], informed her plan is for him to be treated after feeding tube placement.  She agreed to call me when he returns to the Unit.  Tomo RTTs updated.  Gayleen Orem, RN, BSN, [place] On[facility] Nurse [place] at [place] [phone]

## 2016-09-28 NOTE — Progress Notes (Signed)
PROGRESS NOTE  Randy [name] [id number] DOB: 10-19-1955 DOA: 09/25/2016 PCP: Kennon Holter, MD  HPI/Recap of past 24 hours:  Persistent pain in mouth, no bm for a week Fever resolved, no n/v, no sob, no cough,   Assessment/Plan: Principal Problem:   Mucositis due to radiation therapy Active Problems:   Hyperlipidemia   Gout   Obesity   Essential hypertension   Cancer of dorsal tongue (HCC)   Chronic renal insufficiency   Acute on chronic kidney failure (HCC)   Chronic kidney disease, stage 3   Dysphagia, oropharyngeal - severe  Severe Mucositis with ulceration, erythema/dyaphagia:  He presented with with chills and  Fever (101), significant pain, not able to tolerate po intake. wbc wnl,  cxr unremarkable, ua no infection.  On unasyn, diflucan, magic mouth wash, prn analgesics IR consulted for peg placement , however anatomy unfavorable, general surgery consulted. Plan to have feeding tube placed in OR on Monday. Will follow surgery recommendations.  AKI on CKDIII, (sees Dr. Neta Ehlers nephrologist in [facility], [place] Cr baseline 1.9, cr 2.8 on admission. 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 Corliss Skains of [place] in 06/2016 He is half way through XRT   Cancer pain: Pain is not well controlled,   continue home regimen with oxycotin, increase prn oxycodone to q3hrs prn   Morbid Obesity: Body mass index is 43.14 kg/m.     Code Status: full  Family Communication: patient and wife in room  Disposition Plan: remain in the  hospital   Consultants:  IR  General surgery  Procedures:  none  Antibiotics:  unasyn   Objective: BP (!) 130/58 (BP Location: Right Arm)   Pulse 63   Temp 98.7 F (37.1 C) (Oral)   Resp 16   Ht 5\' 11"  (1.803 m)   Wt (!) 140.3 kg (309 lb 4.9 oz)   SpO2 100%   BMI 43.14 kg/m   Intake/Output Summary (Last 24 hours) at 09/28/16 0800 Last data filed at 09/28/16 0600  Gross per 24 hour  Intake             4595 ml  Output             1200 ml  Net             3395 ml   Filed Weights   09/25/16 1538  Weight: (!) 140.3 kg (309 lb 4.9 oz)    Exam:   General:  Does not look comfortable due to mouth sore, significant mucositis with ulcerations, erythema  Cardiovascular: RRR  Respiratory: CTABL  Abdomen: Soft/ND/NT, positive BS  Musculoskeletal: No Edema  Neuro: aaox3  Data Reviewed: Basic Metabolic Panel:  Recent Labs Lab 09/23/16 0858 09/25/16 0939 09/26/16 0353 09/27/16 0346 09/28/16 0451  NA 131* 132* 135 138 139  K 4.4 4.7 4.4 4.1 4.0  CL  --   --  102 105 105  CO2 24 22 23 25 26   GLUCOSE 90 102 122* 118* 126*  BUN 36.1* 65.1* 53* 37* 29*  CREATININE 1.9* 2.8* 1.88* 1.36* 1.43*  CALCIUM 9.5 9.8 9.0 8.9 9.0  MG  --  2.2  --  2.0  --    Liver Function Tests:  Recent Labs Lab 09/25/16 0939  AST 13  ALT 10  ALKPHOS 36*  BILITOT 0.73  PROT 7.1  ALBUMIN 3.4*   No results for input(s): LIPASE, AMYLASE in the last 168 hours. No results for input(s): AMMONIA in the last 168 hours. CBC:  Recent Labs Lab 09/25/16 0939 09/26/16 0353 09/27/16 0346 09/28/16 0451  WBC 4.8 4.0 3.2* 4.0  NEUTROABS 3.1  --  2.0 2.7  HGB 9.3* 8.7* 8.6* 8.4*  HCT 27.8* 26.1* 25.6* 25.8*  MCV 88.5 89.1 90.1 89.6  PLT 184 206 191 209   Cardiac Enzymes:   No results for input(s): CKTOTAL, CKMB, CKMBINDEX, TROPONINI in the last 168 hours. BNP (last 3 results) No results for input(s): BNP in the last 8760 hours.  ProBNP (last 3 results) No results for  input(s): PROBNP in the last 8760 hours.  CBG: No results for input(s): GLUCAP in the last 168 hours.  Recent Results (from the past 240 hour(s))  Urine Culture     Status: None   Collection Time: 09/25/16  1:19 PM  Result Value Ref Range Status   Urine Culture, Routine Final report  Final   Urine Culture result 1 No growth  Final  Culture, Blood     Status: None (Preliminary result)   Collection Time: 09/25/16  1:21 PM  Result Value Ref Range Status   BLOOD CULTURE, ROUTINE Preliminary report  Preliminary   RESULT 1 Comment  Preliminary    Comment: No growth in 36 - 48 hours.  Culture, Blood     Status: None (Preliminary result)   Collection Time: 09/25/16  1:22 PM  Result Value Ref Range Status   BLOOD CULTURE, ROUTINE Preliminary report  Preliminary   RESULT 1 Comment  Preliminary    Comment: No growth in 36 - 48 hours.  Surgical PCR screen     Status: None   Collection Time: 09/28/16  2:41 AM  Result Value Ref Range Status   MRSA, PCR NEGATIVE NEGATIVE Final   Staphylococcus aureus NEGATIVE NEGATIVE Final    Comment:        The Xpert SA Assay (FDA approved for NASAL specimens in patients over 55 years of age), is one component of a comprehensive surveillance program.  Test performance has been validated by [facility] for patients greater than or equal to 33 year old. It is not intended to diagnose infection nor to guide or monitor treatment.      Studies: No results found.  Scheduled Meds: . allopurinol  300 mg Oral Daily  . ALPRAZolam  0.5 mg Oral Daily  . ampicillin-sulbactam (UNASYN) IV  3 g Intravenous Q6H  . atorvastatin  40 mg Oral Daily  . cefoTEtan (CEFOTAN) IV  2 g Intravenous On Call to OR  . Chlorhexidine Gluconate Cloth  6 each Topical Once  . Chlorhexidine Gluconate Cloth  6 each Topical Once  . citalopram  20 mg Oral Daily  . fenofibrate  160 mg Oral QAC breakfast  . fluconazole (DIFLUCAN) IV  100 mg Intravenous Q24H  . lip balm  1  application Topical BID  . oxyCODONE  10 mg Oral Q12H  . polyethylene glycol  17 g Oral Daily  . senna-docusate  1 tablet Oral BID  . Vitamin D (Ergocalciferol)  50,000 Units Oral Q Mon    Continuous Infusions: . dextrose 5 % and 0.9% NaCl 100 mL/hr  at 09/27/16 1828     Time spent: 89mins  Samarion Ehle MD, PhD  [facility] Pager [phone]. If 7PM-7AM, please contact night-coverage at [email or web address], password [facility] 09/28/2016, 8:00 AM  LOS: 3 days

## 2016-09-29 ENCOUNTER — Ambulatory Visit: Payer: Managed Care, Other (non HMO)

## 2016-09-29 ENCOUNTER — Other Ambulatory Visit: Payer: Self-pay | Admitting: Radiology

## 2016-09-29 ENCOUNTER — Encounter: Payer: Self-pay | Admitting: *Deleted

## 2016-09-29 LAB — CBC WITH DIFFERENTIAL/PLATELET
BASOS ABS: 0 10*3/uL (ref 0.0–0.1)
Basophils Relative: 0 %
Eosinophils Absolute: 0.1 10*3/uL (ref 0.0–0.7)
Eosinophils Relative: 2 %
HCT: 26.2 % — ABNORMAL LOW (ref 39.0–52.0)
HEMOGLOBIN: 8.6 g/dL — AB (ref 13.0–17.0)
LYMPHS ABS: 0.4 10*3/uL — AB (ref 0.7–4.0)
LYMPHS PCT: 9 %
MCH: 29.6 pg (ref 26.0–34.0)
MCHC: 32.8 g/dL (ref 30.0–36.0)
MCV: 90 fL (ref 78.0–100.0)
Monocytes Absolute: 0.8 10*3/uL (ref 0.1–1.0)
Monocytes Relative: 18 %
NEUTROS PCT: 71 %
Neutro Abs: 3 10*3/uL (ref 1.7–7.7)
Platelets: 208 10*3/uL (ref 150–400)
RBC: 2.91 MIL/uL — AB (ref 4.22–5.81)
RDW: 13.7 % (ref 11.5–15.5)
WBC: 4.3 10*3/uL (ref 4.0–10.5)

## 2016-09-29 LAB — BASIC METABOLIC PANEL
ANION GAP: 8 (ref 5–15)
BUN: 18 mg/dL (ref 6–20)
CHLORIDE: 107 mmol/L (ref 101–111)
CO2: 28 mmol/L (ref 22–32)
Calcium: 8.8 mg/dL — ABNORMAL LOW (ref 8.9–10.3)
Creatinine, Ser: 1.11 mg/dL (ref 0.61–1.24)
Glucose, Bld: 115 mg/dL — ABNORMAL HIGH (ref 65–99)
POTASSIUM: 3.8 mmol/L (ref 3.5–5.1)
SODIUM: 143 mmol/L (ref 135–145)

## 2016-09-29 MED ORDER — VITAMIN E 15 UNIT/0.3ML PO SOLN
100.0000 [IU] | Freq: Four times a day (QID) | ORAL | Status: DC
  Administered 2016-09-29 – 2016-10-02 (×9): 100 [IU] via ORAL
  Filled 2016-09-29 (×19): qty 2

## 2016-09-29 MED ORDER — MUGARD MT LIQD: 5.0000 mL | OROMUCOSAL | Status: DC | PRN

## 2016-09-29 MED ORDER — VITAMINS A & D EX OINT
TOPICAL_OINTMENT | CUTANEOUS | Status: AC
  Administered 2016-09-29: 08:00:00
  Filled 2016-09-29: qty 5

## 2016-09-29 MED ORDER — ENOXAPARIN SODIUM 40 MG/0.4ML ~~LOC~~ SOLN
40.0000 mg | SUBCUTANEOUS | Status: DC
  Administered 2016-09-29 – 2016-10-01 (×3): 40 mg via SUBCUTANEOUS
  Filled 2016-09-29 (×4): qty 0.4

## 2016-09-29 NOTE — Progress Notes (Addendum)
PROGRESS NOTE  Randy [name] [id number] DOB: [birth year] DOA: 09/25/2016 PCP: Kennon Holter, MD  Brief Summary:   Randy [name] is a 60 y.o. male with medical history significant of tongue cancer, anxiety, CKD, mucositis, hyperlipidemia, hypertension, gout. Symptoms started about two weeks ago with oral pain secondary to radioation therapy. Initially pain was controlled with extra strength Tylenol, but started to worsen last weekend. He was seen this week and started on oxycodone and Oxycontin for pain, which have not helped much. He has been using Mugard, which has helped. He reports developing a fever last night to 101 degrees farenheit. He was seen today for follow-up and recommended for admission to treat severe mucositis with superinfection in addition to significant dehydration.  S/p gastrostomy placement on 12/18, general surgery following   HPI/Recap of past 24 hours:  S/p gastrostomy placement on 12/18 Persistent pain in mouth,  Fever resolved, no n/v, no sob, no cough, constipation has resolved.  Assessment/Plan: Principal Problem:   Mucositis due to radiation therapy Active Problems:   Hyperlipidemia   Gout   Obesity   Essential hypertension   Cancer of dorsal tongue (HCC)   Chronic renal insufficiency   Acute on chronic kidney failure (HCC)   Chronic kidney disease, stage 3   Dysphagia, oropharyngeal - severe  Severe Mucositis with ulceration, erythema/dysphagia:  He presented with with chills and  Fever (101), significant pain, not able to tolerate po intake. wbc wnl,  cxr unremarkable, ua no infection.  On unasyn, diflucan, magic mouth wash, prn analgesics, pain control is an issue, need to continue adjust analgesics, he reports Mugard and viscous lidocaine helped, both are ordered. IR consulted for peg placement , however anatomy unfavorable, S/p gastrostomy tube placement in OR on 12/18. Currently all oral meds held, Will follow surgery  recommendations.  AKI on CKDIII, (sees Dr. Neta Ehlers nephrologist in [facility], [place].) Cr baseline 1.9, cr 2.8 on admission. ua no infection, aki likely from dehydration, on ivf , hold lisinopril ( he also report has been on lasix for years for bilateral lower extremity edema, lasix held, he is dehydrated, no edema) Cr normalized on 12/19.  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 Corliss Skains of [place] in 06/2016 He is halfway through XRT Rad onc consulted, XRT will be on hold for a few days.  Normocytic Anemia , likely related to malignancy. hgb at baseline. Folate/b12/iron studies/retic count pending  Cancer pain: Pain is not well controlled,   continue home regimen with oxycotin, increase prn oxycodone to q3hrs prn  Severe malnutrition in context of chronic illness, Pt has lost 28 lb since September (8% wt loss x 3 months, significant for time frame).  Malnutrition related to dysphagia, mouth pain, chronic illness as evidenced by percent weight loss, energy intake < 75% for > or equal to 1 month. Nutrition input appreciated.  Morbid Obesity: Body mass index is 43.14 kg/m.     Code Status: full  Family Communication: patient and wife in room  Disposition Plan: likely mid week, with general surgery clearance   Consultants:  IR  General surgery  Rad onc Ebony Hail came evaluated patient on 12/19)  Procedures:  OPEN INSERTION OF GASTROSTOMY TUBE (N/A) on 12/18  Antibiotics:  unasyn   Objective: BP 103/90 (BP Location: Right Arm)  Pulse 64   Temp 99.3 F (37.4 C) (Oral)   Resp 18   Ht 5\' 11"  (1.803 m)   Wt (!) 140.3 kg (309 lb 4.9 oz)   SpO2 96%   BMI 43.14 kg/m   Intake/Output Summary (Last 24 hours) at 09/29/16 0901 Last data filed at 09/29/16  0556  Gross per 24 hour  Intake          3376.67 ml  Output              975 ml  Net          2401.67 ml   Filed Weights   09/25/16 1538  Weight: (!) 140.3 kg (309 lb 4.9 oz)    Exam:   General:  Does not look comfortable due to mouth sore, significant mucositis with ulcerations, erythema  Cardiovascular: RRR  Respiratory: CTABL  Abdomen: Soft/ND/NT, positive BS  Musculoskeletal: No Edema  Neuro: aaox3  Data Reviewed: Basic Metabolic Panel:  Recent Labs Lab 09/25/16 0939 09/26/16 0353 09/27/16 0346 09/28/16 0451 09/29/16 0340  NA 132* 135 138 139 143  K 4.7 4.4 4.1 4.0 3.8  CL  --  102 105 105 107  CO2 22 23 25 26 28   GLUCOSE 102 122* 118* 126* 115*  BUN 65.1* 53* 37* 29* 18  CREATININE 2.8* 1.88* 1.36* 1.43* 1.11  CALCIUM 9.8 9.0 8.9 9.0 8.8*  MG 2.2  --  2.0  --   --    Liver Function Tests:  Recent Labs Lab 09/25/16 0939  AST 13  ALT 10  ALKPHOS 36*  BILITOT 0.73  PROT 7.1  ALBUMIN 3.4*   No results for input(s): LIPASE, AMYLASE in the last 168 hours. No results for input(s): AMMONIA in the last 168 hours. CBC:  Recent Labs Lab 09/25/16 0939 09/26/16 0353 09/27/16 0346 09/28/16 0451 09/29/16 0340  WBC 4.8 4.0 3.2* 4.0 4.3  NEUTROABS 3.1  --  2.0 2.7 3.0  HGB 9.3* 8.7* 8.6* 8.4* 8.6*  HCT 27.8* 26.1* 25.6* 25.8* 26.2*  MCV 88.5 89.1 90.1 89.6 90.0  PLT 184 206 191 209 208   Cardiac Enzymes:   No results for input(s): CKTOTAL, CKMB, CKMBINDEX, TROPONINI in the last 168 hours. BNP (last 3 results) No results for input(s): BNP in the last 8760 hours.  ProBNP (last 3 results) No results for input(s): PROBNP in the last 8760 hours.  CBG: No results for input(s): GLUCAP in the last 168 hours.  Recent Results (from the past 240 hour(s))  Urine Culture     Status: None   Collection Time: 09/25/16  1:19 PM  Result Value Ref Range Status   Urine Culture, Routine Final report  Final   Urine Culture result 1 No growth  Final  Culture,  Blood     Status: None (Preliminary result)   Collection Time: 09/25/16  1:21 PM  Result Value Ref Range Status   BLOOD CULTURE, ROUTINE Preliminary report  Preliminary   RESULT 1 Comment  Preliminary    Comment: No growth in 36 - 48 hours.  Culture, Blood     Status: None (Preliminary result)   Collection Time: 09/25/16  1:22 PM  Result Value Ref Range Status   BLOOD CULTURE, ROUTINE Preliminary report  Preliminary   RESULT 1 Comment  Preliminary    Comment: No growth in 36 - 48 hours.  Surgical PCR screen     Status: None   Collection Time: 09/28/16  2:41 AM  Result Value Ref  Range Status   MRSA, PCR NEGATIVE NEGATIVE Final   Staphylococcus aureus NEGATIVE NEGATIVE Final    Comment:        The Xpert SA Assay (FDA approved for NASAL specimens in patients over 74 years of age), is one component of a comprehensive surveillance program.  Test performance has been validated by [facility] for patients greater than or equal to 52 year old. It is not intended to diagnose infection nor to guide or monitor treatment.      Studies: No results found.  Scheduled Meds: . allopurinol  300 mg Oral Daily  . ALPRAZolam  0.5 mg Oral Daily  . ampicillin-sulbactam (UNASYN) IV  3 g Intravenous Q6H  . atorvastatin  40 mg Oral Daily  . citalopram  20 mg Oral Daily  . enoxaparin (LOVENOX) injection  40 mg Subcutaneous Q24H  . fenofibrate  160 mg Oral QAC breakfast  . fluconazole (DIFLUCAN) IV  100 mg Intravenous Q24H  . lip balm  1 application Topical BID  . oxyCODONE  10 mg Oral Q12H  . polyethylene glycol  17 g Oral Daily  . senna-docusate  1 tablet Oral BID  . Vitamin D (Ergocalciferol)  50,000 Units Oral Q Mon    Continuous Infusions: . dextrose 5 % and 0.9% NaCl 100 mL/hr at 09/28/16 1849     Time spent: 29mins  Lavonda Thal MD, PhD  [facility] Pager [phone]. If 7PM-7AM, please contact night-coverage at [email or web address], password [facility] 09/29/2016, 9:01 AM  LOS: 4 days

## 2016-09-29 NOTE — Progress Notes (Signed)
1 Day Post-Op  Subjective: He sounds congested and says it's been that way since this began, it's a little worse now.  Still some bleeding from his lips.  He complains of abdominal pain with movement and cough, on top of his chronic pain.  Gastrostomy site OK, draining fluid, he say they emptied the foley bag x 1 this AM and it has about 200+ ml in it now.    Objective: Vital signs in last 24 hours: Temp:  [97.4 F (36.3 C)-99.4 F (37.4 C)] 99.3 F (37.4 C) (12/19 0555) Pulse Rate:  [61-78] 64 (12/19 0555) Resp:  [12-19] 18 (12/19 0555) BP: (103-156)/(59-94) 103/90 (12/19 0555) SpO2:  [93 %-100 %] 96 % (12/19 0555) Last BM Date: 09/27/16 PO 11 IV 3300 Urine 750 recorded Gastrostomy:  200 Afebrile, VSS H/H is stable, creatinine is improving Intake/Output from previous day: 12/18 0701 - 12/19 0700 In: 3376.7 [P.O.:11; I.V.:2565.7] Out: 975 [Urine:750; Drains:200; Blood:25] Intake/Output this shift: No intake/output data recorded.  General appearance: alert, cooperative, no distress and feels miserable.   Resp: clear to auscultation bilaterally GI: sites all look good draining of stomach contents from gastrostomy tube.  Lab Results:   Recent Labs  09/28/16 0451 09/29/16 0340  WBC 4.0 4.3  HGB 8.4* 8.6*  HCT 25.8* 26.2*  PLT 209 208    BMET  Recent Labs  09/28/16 0451 09/29/16 0340  NA 139 143  K 4.0 3.8  CL 105 107  CO2 26 28  GLUCOSE 126* 115*  BUN 29* 18  CREATININE 1.43* 1.11  CALCIUM 9.0 8.8*   PT/INR No results for input(s): LABPROT, INR in the last 72 hours.   Recent Labs Lab 09/25/16 0939  AST 13  ALT 10  ALKPHOS 36*  BILITOT 0.73  PROT 7.1  ALBUMIN 3.4*     Lipase  No results found for: LIPASE   Studies/Results: No results found.  Medications: . allopurinol  300 mg Oral Daily  . ALPRAZolam  0.5 mg Oral Daily  . ampicillin-sulbactam (UNASYN) IV  3 g Intravenous Q6H  . atorvastatin  40 mg Oral Daily  . citalopram  20 mg Oral  Daily  . fenofibrate  160 mg Oral QAC breakfast  . fluconazole (DIFLUCAN) IV  100 mg Intravenous Q24H  . lip balm  1 application Topical BID  . oxyCODONE  10 mg Oral Q12H  . polyethylene glycol  17 g Oral Daily  . senna-docusate  1 tablet Oral BID  . vitamin A & D      . Vitamin D (Ergocalciferol)  50,000 Units Oral Q Mon   . 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     Plan:  Continue open gastrotomy today and then tomorrow start trickle feeds, then advance as tolerated.  :   LOS: 4 days    Canisha Issac 09/29/2016 [phone]

## 2016-09-29 NOTE — Progress Notes (Signed)
Oncology Nurse Navigator Documentation  Visited Mr. Randy [name] 1164 to check on his well being.  His wife was bedside. Subjectively, he showed improvement from yesterday, talked more easily. He reported some resolution of mucositis. I reviewed contents of PEG starter kit provided by [facility] (6 syringes, ca 20 packets 4x4 split gauze, roll of paper tape, bag of gloves).  He understands I will supplement teaching he receives on use of PEG for hydration/nutrition. Per his discussion with PAC Shona Simpson, RT will be on hold for the next several days. They understand I can be contacted with needs/concerns.  Gayleen Orem, RN, BSN, [place] Oncology Nurse [place] at [place] [phone]

## 2016-09-30 ENCOUNTER — Telehealth: Payer: Self-pay | Admitting: Radiation Oncology

## 2016-09-30 ENCOUNTER — Encounter: Payer: Self-pay | Admitting: *Deleted

## 2016-09-30 ENCOUNTER — Ambulatory Visit: Payer: Self-pay | Admitting: Nurse Practitioner

## 2016-09-30 ENCOUNTER — Ambulatory Visit: Payer: Managed Care, Other (non HMO)

## 2016-09-30 DIAGNOSIS — R1310 Dysphagia, unspecified: Secondary | ICD-10-CM

## 2016-09-30 LAB — CBC WITH DIFFERENTIAL/PLATELET
Basophils Absolute: 0 10*3/uL (ref 0.0–0.1)
Basophils Relative: 0 %
EOS ABS: 0.1 10*3/uL (ref 0.0–0.7)
EOS PCT: 1 %
HCT: 26.1 % — ABNORMAL LOW (ref 39.0–52.0)
Hemoglobin: 8.5 g/dL — ABNORMAL LOW (ref 13.0–17.0)
LYMPHS ABS: 0.6 10*3/uL — AB (ref 0.7–4.0)
Lymphocytes Relative: 15 %
MCH: 29.5 pg (ref 26.0–34.0)
MCHC: 32.6 g/dL (ref 30.0–36.0)
MCV: 90.6 fL (ref 78.0–100.0)
MONO ABS: 0.7 10*3/uL (ref 0.1–1.0)
MONOS PCT: 16 %
Neutro Abs: 2.8 10*3/uL (ref 1.7–7.7)
Neutrophils Relative %: 68 %
PLATELETS: 211 10*3/uL (ref 150–400)
RBC: 2.88 MIL/uL — ABNORMAL LOW (ref 4.22–5.81)
RDW: 13.7 % (ref 11.5–15.5)
WBC: 4.1 10*3/uL (ref 4.0–10.5)

## 2016-09-30 LAB — FOLATE: FOLATE: 21.7 ng/mL (ref >5.9)

## 2016-09-30 LAB — BASIC METABOLIC PANEL
Anion gap: 9 (ref 5–15)
BUN: 13 mg/dL (ref 6–20)
CALCIUM: 8.7 mg/dL — AB (ref 8.9–10.3)
CHLORIDE: 108 mmol/L (ref 101–111)
CO2: 30 mmol/L (ref 22–32)
CREATININE: 1.07 mg/dL (ref 0.61–1.24)
GFR calc Af Amer: >60 mL/min (ref >60)
GFR calc non Af Amer: >60 mL/min (ref >60)
Glucose, Bld: 122 mg/dL — ABNORMAL HIGH (ref 65–99)
Potassium: 3.3 mmol/L — ABNORMAL LOW (ref 3.5–5.1)
Sodium: 147 mmol/L — ABNORMAL HIGH (ref 135–145)

## 2016-09-30 LAB — MAGNESIUM: MAGNESIUM: 1.7 mg/dL (ref 1.7–2.4)

## 2016-09-30 LAB — IRON AND TIBC
Iron: 18 ug/dL — ABNORMAL LOW (ref 45–182)
SATURATION RATIOS: 7 % — AB (ref 17.9–39.5)
TIBC: 253 ug/dL (ref 250–450)
UIBC: 235 ug/dL

## 2016-09-30 LAB — GLUCOSE, CAPILLARY
Glucose-Capillary: 116 mg/dL — ABNORMAL HIGH (ref 65–99)
Glucose-Capillary: 120 mg/dL — ABNORMAL HIGH (ref 65–99)

## 2016-09-30 LAB — RETICULOCYTES
RBC.: 2.88 MIL/uL — ABNORMAL LOW (ref 4.22–5.81)
Retic Count, Absolute: 31.7 10*3/uL (ref 19.0–186.0)
Retic Ct Pct: 1.1 % (ref 0.4–3.1)

## 2016-09-30 LAB — VITAMIN B12: Vitamin B-12: 301 pg/mL (ref 180–914)

## 2016-09-30 MED ORDER — PRO-STAT SUGAR FREE PO LIQD
30.0000 mL | Freq: Two times a day (BID) | ORAL | Status: DC
  Administered 2016-09-30 – 2016-10-01 (×3): 30 mL
  Filled 2016-09-30 (×3): qty 30

## 2016-09-30 MED ORDER — SENNOSIDES 8.8 MG/5ML PO SYRP
5.0000 mL | ORAL_SOLUTION | Freq: Two times a day (BID) | ORAL | Status: DC
  Administered 2016-10-01: 5 mL
  Filled 2016-09-30 (×5): qty 5

## 2016-09-30 MED ORDER — FENTANYL 12 MCG/HR TD PT72
12.5000 ug | MEDICATED_PATCH | TRANSDERMAL | Status: DC
  Administered 2016-09-30: 12.5 ug via TRANSDERMAL
  Filled 2016-09-30: qty 1

## 2016-09-30 MED ORDER — JEVITY 1.2 CAL PO LIQD: 1000.0000 mL | ORAL | Status: DC

## 2016-09-30 MED ORDER — OSMOLITE 1.5 CAL PO LIQD
1000.0000 mL | ORAL | Status: DC
  Administered 2016-09-30: 1000 mL
  Filled 2016-09-30 (×2): qty 1000

## 2016-09-30 MED ORDER — DOCUSATE SODIUM 50 MG/5ML PO LIQD
50.0000 mg | Freq: Two times a day (BID) | ORAL | Status: DC
  Administered 2016-10-01 (×3): 50 mg
  Filled 2016-09-30 (×6): qty 10

## 2016-09-30 MED ORDER — VITAMINS A & D EX OINT
TOPICAL_OINTMENT | CUTANEOUS | Status: AC
  Administered 2016-09-30: 16:00:00
  Filled 2016-09-30: qty 5

## 2016-09-30 NOTE — Progress Notes (Signed)
Nutrition Follow-up  DOCUMENTATION CODES:   Severe malnutrition in context of chronic illness, Morbid obesity  INTERVENTION:   Monitor magnesium, potassium, and phosphorus daily for at least 3 days, MD to replete as needed, as pt is at risk for refeeding syndrome given severe malnutrition and poor PO intake for >1 week.  Initiate Osmolite 1.5  @ 20 ml/hr via PEG and increase by 10 ml every 24 hours to goal rate of 70 ml/hr. (May advance faster if tolerates 20 ml/hr) 30 ml Prostat BID.   Tube feeding regimen provides 2720 kcal (100% of needs), 135 grams of protein, and 1280 ml of H2O.   -Provide Liquid MVI -Recommend IV Thiamine 100 mg daily for at least 3 days given refeeding risk.  RD to continue to follow   NUTRITION DIAGNOSIS:   Malnutrition related to dysphagia, mouth pain, chronic illness as evidenced by percent weight loss, energy intake < 75% for > or equal to 1 month.  Ongoing.  GOAL:   Patient will meet greater than or equal to 90% of their needs  Not meeting.  MONITOR:   Labs, Weight trends, TF tolerance, I & O's  REASON FOR ASSESSMENT:   Consult Enteral/tube feeding initiation and management  ASSESSMENT:   60 y.o. male with medical history significant of tongue cancer, anxiety, CKD, mucositis, hyperlipidemia, hypertension, gout. Symptoms started about two weeks ago with oral pain secondary to radioation therapy. Initially pain was controlled with extra strength Tylenol, but started to worsen last weekend. He was seen this week and started on oxycodone and Oxycontin for pain, which have not helped much. He has been using Mugard, which has helped. He reports developing a fever last night to 101 degrees farenheit. He was seen today for follow-up and recommended for admission to treat severe mucositis with superinfection in addition to significant dehydration.  Patient s/p PEG placement 12/18. Pt delaying radiation treatments x few days.   PEG is now ready to  use. Placed order for Osmolite 1.5 @ 20 ml/hr. Slow advancement given refeeding risk. Goal rate is 70 ml/hr. Will order MVI and recommend IV Thiamine supplementation. Plan reviewed with RN.  Medications: Vitamin E solution QID Labs reviewed: Elevated Na Low K Mg WNL Vitamin B-12 WNL  Diet Order:  Diet NPO time specified  Skin:  Reviewed, no issues  Last BM:  12/18  Height:   Ht Readings from Last 1 Encounters:  09/25/16 5\' 11"  (1.803 m)    Weight:   Wt Readings from Last 1 Encounters:  09/25/16 (!) 309 lb 4.9 oz (140.3 kg)    Ideal Body Weight:  78.2 kg  BMI:  Body mass index is 43.14 kg/m.  Estimated Nutritional Needs:   Kcal:  2600-2800  Protein:  130-150g  Fluid:  2.8L/day  EDUCATION NEEDS:   Education needs addressed  Clayton Bibles, MS, RD, LDN Pager: [phone] After Hours Pager: [phone]

## 2016-09-30 NOTE — Progress Notes (Signed)
2 Days Post-Op  Subjective: Feeling better. Less pain  Objective: Vital signs in last 24 hours: Temp:  [98.4 F (36.9 C)-100 F (37.8 C)] 98.4 F (36.9 C) (12/20 0439) Pulse Rate:  [50-65] 50 (12/20 0439) Resp:  [14-16] 16 (12/20 0439) BP: (128-140)/(57-61) 132/60 (12/20 0439) SpO2:  [94 %-98 %] 96 % (12/20 0439) Last BM Date: 09/28/16  Intake/Output from previous day: 12/19 0701 - 12/20 0700 In: -  Out: 5200 [Urine:1950; Drains:3250] Intake/Output this shift: Total I/O In: -  Out: 1000 [Drains:1000]  Resp: clear to auscultation bilaterally Cardio: regular rate and rhythm GI: soft, appropriately tender. incision ok  Lab Results:   Recent Labs  09/29/16 0340 09/30/16 0401  WBC 4.3 4.1  HGB 8.6* 8.5*  HCT 26.2* 26.1*  PLT 208 211   BMET  Recent Labs  09/29/16 0340 09/30/16 0401  NA 143 147*  K 3.8 3.3*  CL 107 108  CO2 28 30  GLUCOSE 115* 122*  BUN 18 13  CREATININE 1.11 1.07  CALCIUM 8.8* 8.7*   PT/INR No results for input([name]): LABPROT, INR in the last 72 hours. ABG No results for input([name]): PHART, HCO3 in the last 72 hours.  Invalid input([name]): PCO2, PO2  Studies/Results: No results found.  Anti-infectives: Anti-infectives    Start     Dose/Rate Route Frequency Ordered Stop   09/28/16 0600  cefoTEtan (CEFOTAN) 2 g in dextrose 5 % 50 mL IVPB     2 g 100 mL/hr over 30 Minutes Intravenous On call to O.R. 09/27/16 0332 09/28/16 1230   09/27/16 1000  fluconazole (DIFLUCAN) IVPB 100 mg     100 mg 50 mL/hr over 60 Minutes Intravenous Every 24 hours 09/26/16 0942 09/30/16 1116   09/27/16 0600  cefoTEtan (CEFOTAN) 2 g in dextrose 5 % 50 mL IVPB  Status:  Discontinued     2 g 100 mL/hr over 30 Minutes Intravenous On call to O.R. 09/26/16 1417 09/27/16 0332   09/26/16 1400  Ampicillin-Sulbactam (UNASYN) 3 g in sodium chloride 0.9 % 100 mL IVPB     3 g 200 mL/hr over 30 Minutes Intravenous Every 6 hours 09/26/16 1020     09/26/16 1000  fluconazole  (DIFLUCAN) IVPB 200 mg     200 mg 100 mL/hr over 60 Minutes Intravenous  Once 09/26/16 0942 09/26/16 1115   09/26/16 0000  Ampicillin-Sulbactam (UNASYN) 3 g in sodium chloride 0.9 % 100 mL IVPB  Status:  Discontinued     3 g 200 mL/hr over 30 Minutes Intravenous Every 8 hours 09/25/16 1643 09/26/16 1020   09/25/16 1630  Ampicillin-Sulbactam (UNASYN) 3 g in sodium chloride 0.9 % 100 mL IVPB     3 g 200 mL/hr over 30 Minutes Intravenous STAT 09/25/16 1609 09/25/16 1713      Assessment/Plan: [name]/p Procedure([name]): OPEN INSERTION OF GASTROSTOMY TUBE (N/A) Advance diet. Start tube feeds per G tube Will need staples out in 10-14 days  LOS: 5 days    Randy [name],Randy [name] 09/30/2016

## 2016-09-30 NOTE — Progress Notes (Signed)
Oncology Nurse Navigator Documentation  Visited Mr. Von Lownes [id number] to check on his well-being. He reported:   "Around 4:00 o'clock yesterday afternoon I got this sudden burst of energy, felt much better".  Has been drinking water.  Started 12 mcg Fentanyl patch today.  I reviewed his understanding of patch purpose and frequency of change.  He is accepting of plan to restart RT next Tuesday.  He expressed concern about recurrence of mucositis when he starts, recognized he has 3 weeks of treatment remaining.  "I can't go through this again."  I acknowledged his concern, noting mucositis is a recognized SE of RT.  Expressed appreciation for visit by RTTs Jeannetta Nap and Merrilee Seashore.  I assured him I would express to them his appreciation. RN Legrand Rams was initiating feeding pump at 20 c/hr, noting goal of 70 cc/hr.  Unclear to Mr. Lampshire whether plan is for him to go home with pump.  He indicated he is comfortable with bolus feeding per instruction received and return demonstration provided at 12/12 [facility].  I encouraged him to report this to nutritionist and his MD.  I offered additional instruction if he wished prior to his DC; he denied the need. He understands I will continue to check on him during his admission.  Gayleen Orem, RN, BSN, [facility] Nurse [place] [facility] [place] [phone]

## 2016-09-30 NOTE — Progress Notes (Signed)
I stopped by to see the patient today. Unfortunately since his last outpatient visit with me, he developed progressive mucositis with necrotic features despite our attempts to start Mugard and other oral preparations to soothe his mucosa. His labs while I was away indicated concerns for the patient being dehydrated. He was seen in symptom management clinic and was going to receive hydration, but ultimately went on to [facility] for admission. He has had normalization of his BUN/Creatinine, and continues with pain management with morphine and IV hydration. He received a gastrostomy tube which was placed surgically on 09/28/16, and will begin enteral feeds. During our discussion the patient indicated that he was not comfortable moving forward with any additional radiation with this clinical picture. Although we do not generally interrupt treatments, in his case, he is having significant trouble with mucositis which has resulted in 10% weight loss in the last few weeks. We will follow along during his hospitalization, and monitor his symptoms. I expect his symptoms will improve in the next 1 week with mugard, aqueous vitamin e drops, and enteral feeds. He is going to switch to fentanyl patches today for long acting pain relief and once able to tolerate PO, will transition to oral immediate acting pain medication.      Carola Rhine, PAC

## 2016-09-30 NOTE — Progress Notes (Signed)
Pharmacy Antibiotic Note  Randy [name] is a 60 y.o. male with PMH of tongue cancer currently receiving radiation therapy admitted on 09/25/2016 with concern for superinfection of mucositis. Pharmacy has been consulted for Unasyn dosing.  Today, 09/30/2016  Day 6 Unasyn, Fluconazole ? Planned length of therapy ?  Plan: Unasyn 3g IV q6h Fluconazole 200mg  x1, then 100mg  IV q24 Pharmacy will follow at distance.  Height: 5\' 11"  (180.3 cm) Weight: (!) 309 lb 4.9 oz (140.3 kg) IBW/kg (Calculated) : 75.3  Temp (24hrs), Avg:99.3 F (37.4 C), Min:98.4 F (36.9 C), Max:100 F (37.8 C)   Recent Labs Lab 09/26/16 0353 09/27/16 0346 09/28/16 0451 09/29/16 0340 09/30/16 0401  WBC 4.0 3.2* 4.0 4.3 4.1  CREATININE 1.88* 1.36* 1.43* 1.11 1.07    Estimated Creatinine Clearance: 103.9 mL/min (by C-G formula based on SCr of 1.07 mg/dL).    No Known Allergies  Antimicrobials this admission: 12/15 >> Unasyn >> 12/15 >> Fluconazole >>  Dose adjustments this admission: 12/16 Unasyn 3gm q8 increased to q6hr  Microbiology results: None ordered  Thank you for allowing pharmacy to be a part of this patient's care.  Minda Ditto PharmD Pager [phone] 09/30/2016, 11:35 AM

## 2016-09-30 NOTE — Progress Notes (Signed)
PROGRESS NOTE  KAMI ROTAR [id number] DOB: 1956-08-21 DOA: 09/25/2016 PCP: Kennon Holter, MD  Brief History:   60 y.o. male with medical history significant of tongue SCC, anxiety, CKD, mucositis, hyperlipidemia, hypertension, gout. Symptoms started about two weeks ago with oral pain secondary to XRT. Initially pain was controlled with extra strength Tylenol, but started to worsen last weekend prior to admission. The patient was seen by his primary care physician who started him on oxycodone and OxyContin for pain which have not helped. The patient has been using Mugard, which has helped.  On the night before admission, the patient had a fever up to 101.91F. As a result, the patient was seen by his medical oncologist on 09/25/2016 and recommended admission for dehydration and uncontrolled pain secondary to his mucositis.  Prior to admission, the patient had a previously scheduled for chest tube placement for 10/01/2016. Patient underwent a left partial glossectomy with neck dissection on 07/06/2016.  He is currently in the midst of radiation treatments on a daily basis Monday through Friday. I was consulted for percutaneous gastrostomy tube placement. However after CT of abdomen and pelvis revealed that there was unfavorable anatomy, general surgery was consulted for open gastrostomy tube placement.  This was performed on 09/28/2016.  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 Corliss Skains of [place] in 06/2016 -He is halfway through XRT -Rad onc consulted, XRT will be on hold till after Christmas  Cancer pain: Pain is better but now has pain at surgical site -start fentanyl TD as OxyContin cannot be crushed  Severe malnutrition in context of chronic illness, Pt has lost 28 lb since September (8% wt loss x 3 months, significant for time frame).  Malnutritionrelated to dysphagia, mouth pain, chronic illnessas evidenced by percent weight loss, energy intake < 75% for > or equal to 1 month. Nutrition input appreciated.  Morbid Obesity: Body mass index is 43.14 kg/m  Constipation: stool regimen   Disposition Plan:   Home in 1-2 days  Family Communication:   No Family at bedside--Total time spent 35 minutes.  Greater than 50% spent face to face counseling and coordinating care.   Consultants: CCS, IR, Rad Onc  Code Status:  FULL  DVT Prophylaxis: [place] Lovenox   Procedures: As Listed in Progress Note Above  Antibiotics: Unasyn 12/15>>> Fluconazole 12/16>>>   Subjective: Patient continues to have pain in his mouth but it is improving. He has pain at the surgical site from the gastrostomy tube. Denies any nausea, vomiting, diarrhea, chest pain, short of breath. Denies fevers, chills, headache, neck pain.  Objective: Vitals:   09/29/16 1352 09/29/16 1713 09/29/16 2129 09/30/16 0439  BP: (!) 141/55 128/61 (!) 140/57 132/60  Pulse: 64 65 (!) 56 (!) 50  Resp: 18 16 14 16   Temp: 99.3 F (37.4 C) 100 F (37.8 C) 99.5 F (37.5 C) 98.4 F (36.9 C)  TempSrc: Oral Oral Oral Oral  SpO2: 93% 98% 94% 96%  Weight:  Height:        Intake/Output Summary (Last 24 hours) at 09/30/16 1358 Last data filed at 09/30/16 0815  Gross per 24 hour  Intake                0 ml  Output             5400 ml  Net            -5400 ml   Weight change:  Exam:   General:  Pt is alert, follows commands appropriately, not in  acute distress  HEENT: No icterus, No thrush, No neck mass, [place]/AT  Cardiovascular: RRR, S1/S2, no rubs, no gallops  Respiratory: CTA bilaterally, no wheezing, no crackles, no rhonchi  Abdomen: Soft/+BS, tender at G-tube and surgical site, non distended, no guarding  Extremities: trace LE edema, No lymphangitis, No petechiae, No rashes, no synovitis   Data Reviewed: I have personally reviewed following labs and imaging studies Basic Metabolic Panel:  Recent Labs Lab 09/25/16 0939 09/26/16 0353 09/27/16 0346 09/28/16 0451 09/29/16 0340 09/30/16 0401  NA 132* 135 138 139 143 147*  K 4.7 4.4 4.1 4.0 3.8 3.3*  CL  --  102 105 105 107 108  CO2 22 23 25 26 28 30   GLUCOSE 102 122* 118* 126* 115* 122*  BUN 65.1* 53* 37* 29* 18 13  CREATININE 2.8* 1.88* 1.36* 1.43* 1.11 1.07  CALCIUM 9.8 9.0 8.9 9.0 8.8* 8.7*  MG 2.2  --  2.0  --   --  1.7   Liver Function Tests:  Recent Labs Lab 09/25/16 0939  AST 13  ALT 10  ALKPHOS 36*  BILITOT 0.73  PROT 7.1  ALBUMIN 3.4*   No results for input(s): LIPASE, AMYLASE in the last 168 hours. No results for input(s): AMMONIA in the last 168 hours. Coagulation Profile: No results for input(s): INR, PROTIME in the last 168 hours. CBC:  Recent Labs Lab 09/25/16 0939 09/26/16 0353 09/27/16 0346 09/28/16 0451 09/29/16 0340 09/30/16 0401  WBC 4.8 4.0 3.2* 4.0 4.3 4.1  NEUTROABS 3.1  --  2.0 2.7 3.0 2.8  HGB 9.3* 8.7* 8.6* 8.4* 8.6* 8.5*  HCT 27.8* 26.1* 25.6* 25.8* 26.2* 26.1*  MCV 88.5 89.1 90.1 89.6 90.0 90.6  PLT 184 206 191 209 208 211   Cardiac Enzymes: No results for input(s): CKTOTAL, CKMB, CKMBINDEX, TROPONINI in the last 168 hours. BNP: Invalid input(s): POCBNP CBG: No results for input(s): GLUCAP in the last 168 hours. HbA1C: No results for input(s): HGBA1C in the last 72 hours. Urine analysis:    Component Value Date/Time   LABSPEC 1.010 09/25/2016 1319   PHURINE 5.0 09/25/2016 1319   GLUCOSEU Negative  09/25/2016 1319   HGBUR Negative 09/25/2016 1319   BILIRUBINUR Negative 09/25/2016 1319   KETONESUR Negative 09/25/2016 1319   PROTEINUR Negative 09/25/2016 1319   UROBILINOGEN 0.2 09/25/2016 1319   NITRITE Negative 09/25/2016 1319   LEUKOCYTESUR Negative 09/25/2016 1319   Sepsis Labs: @LABRCNTIP (procalcitonin:4,lacticidven:4) ) Recent Results (from the past 240 hour(s))  Urine Culture     Status: None   Collection Time: 09/25/16  1:19 PM  Result Value Ref Range Status   Urine Culture, Routine Final report  Final   Urine Culture result 1 No growth  Final  Culture, Blood     Status: None (Preliminary result)   Collection Time: 09/25/16  1:21 PM  Result Value Ref Range Status   BLOOD CULTURE, ROUTINE Preliminary report  Preliminary   RESULT  1 Comment  Preliminary    Comment: No growth in 36 - 48 hours.  Culture, Blood     Status: None (Preliminary result)   Collection Time: 09/25/16  1:22 PM  Result Value Ref Range Status   BLOOD CULTURE, ROUTINE Preliminary report  Preliminary   RESULT 1 Comment  Preliminary    Comment: No growth in 36 - 48 hours.  Surgical PCR screen     Status: None   Collection Time: 09/28/16  2:41 AM  Result Value Ref Range Status   MRSA, PCR NEGATIVE NEGATIVE Final   Staphylococcus aureus NEGATIVE NEGATIVE Final    Comment:        The Xpert SA Assay (FDA approved for NASAL specimens in patients over 40 years of age), is one component of a comprehensive surveillance program.  Test performance has been validated by [facility] for patients greater than or equal to 76 year old. It is not intended to diagnose infection nor to guide or monitor treatment.      Scheduled Meds: . allopurinol  300 mg Oral Daily  . ALPRAZolam  0.5 mg Oral Daily  . ampicillin-sulbactam (UNASYN) IV  3 g Intravenous Q6H  . atorvastatin  40 mg Oral Daily  . citalopram  20 mg Oral Daily  . enoxaparin (LOVENOX) injection  40 mg Subcutaneous Q24H  . fenofibrate  160 mg  Oral QAC breakfast  . lip balm  1 application Topical BID  . oxyCODONE  10 mg Oral Q12H  . polyethylene glycol  17 g Oral Daily  . senna-docusate  1 tablet Oral BID  . vitamin A & D      . Vitamin D (Ergocalciferol)  50,000 Units Oral Q Mon  . vitamin e  100 Units Oral QID   Continuous Infusions: . dextrose 5 % and 0.9% NaCl 100 mL/hr at 09/29/16 0941    Procedures/Studies: Ct Abdomen Wo Contrast  Addendum Date: 09/26/2016   ADDENDUM REPORT: 09/26/2016 13:27 ADDENDUM: The CT examination was performed for assessment of anatomy prior to potential percutaneous gastrostomy tube placement secondary to oral mucositis from radiation therapy to treat tongue carcinoma. The study shows unfavorable anatomy for gastrostomy tube placement with a high stomach present and a high transverse colon which lies anterior to the majority of the body of the stomach. Surgical consultation is recommended to determine if the patient would be a candidate for surgical gastrostomy or jejunostomy. Electronically Signed   By: Aletta Edouard M.D.   On: 09/26/2016 13:27   Result Date: 09/26/2016 CLINICAL DATA:  Evaluate for G-tube placement. EXAM: CT ABDOMEN WITHOUT CONTRAST TECHNIQUE: Multidetector CT imaging of the abdomen was performed following the standard protocol without IV contrast. COMPARISON:  None. FINDINGS: Lower chest: Heart is borderline in size. Linear atelectasis or scarring in the lung bases. No effusions. Hepatobiliary: No visible focal hepatic abnormality. High-density layering material in the gallbladder could reflect small layering stones. Pancreas: No focal abnormality or ductal dilatation. Spleen: No focal abnormality.  Normal size. Adrenals/Urinary Tract: Bilateral perinephric stranding. No hydronephrosis. Low-density areas in the kidneys bilaterally likely reflects cysts. Adrenal glands unremarkable. Stomach/Bowel: Stomach, visualized large and small bowel decompressed and unremarkable.  Vascular/Lymphatic: Aortic and proximal common iliac calcifications. No aneurysm. No adenopathy. Other: No free fluid or free air. Musculoskeletal: No acute bony abnormality or focal bone lesion. IMPRESSION: Mild bilateral perinephric stranding. No hydronephrosis. This likely is related to of prior insults. Bilateral renal cysts. No acute findings in the abdomen or pelvis. Aortoiliac atherosclerosis. Electronically  Signed: By: Rolm Baptise M.D. On: 09/26/2016 09:21   Dg Chest 1 View  Result Date: 09/26/2016 CLINICAL DATA:  Fever EXAM: CHEST 1 VIEW COMPARISON:  None. FINDINGS: Cardiomegaly. No confluent airspace opacities, effusions or edema. No acute bony abnormality. IMPRESSION: Cardiomegaly.  No active disease. Electronically Signed   By: Rolm Baptise M.D.   On: 09/26/2016 14:45    Kaisa Wofford, DO  [facility] Pager [phone]  If 7PM-7AM, please contact night-coverage [email or web address] Password [email or web address] 09/30/2016, 1:58 PM   LOS: 5 days

## 2016-09-30 NOTE — Telephone Encounter (Signed)
I spoke with patient's wife and we will hold off on radiation treatment for the remainder of the week due to his systems. I will check back in with him tomorrow and we may resume radiotherapy next week.

## 2016-10-01 ENCOUNTER — Ambulatory Visit (HOSPITAL_COMMUNITY): Admission: RE | Admit: 2016-10-01 | Payer: Managed Care, Other (non HMO) | Source: Ambulatory Visit

## 2016-10-01 ENCOUNTER — Other Ambulatory Visit (HOSPITAL_COMMUNITY): Payer: Self-pay

## 2016-10-01 ENCOUNTER — Ambulatory Visit: Payer: Managed Care, Other (non HMO)

## 2016-10-01 ENCOUNTER — Encounter: Payer: Self-pay | Admitting: *Deleted

## 2016-10-01 LAB — GLUCOSE, CAPILLARY
GLUCOSE-CAPILLARY: 130 mg/dL — AB (ref 65–99)
GLUCOSE-CAPILLARY: 125 mg/dL — AB (ref 65–99)
GLUCOSE-CAPILLARY: 99 mg/dL (ref 65–99)
Glucose-Capillary: 150 mg/dL — ABNORMAL HIGH (ref 65–99)
Glucose-Capillary: 92 mg/dL (ref 65–99)

## 2016-10-01 LAB — MAGNESIUM: Magnesium: 1.8 mg/dL (ref 1.7–2.4)

## 2016-10-01 LAB — BASIC METABOLIC PANEL
ANION GAP: 7 (ref 5–15)
BUN: 18 mg/dL (ref 6–20)
CALCIUM: 8.3 mg/dL — AB (ref 8.9–10.3)
CHLORIDE: 104 mmol/L (ref 101–111)
CO2: 31 mmol/L (ref 22–32)
Creatinine, Ser: 1.09 mg/dL (ref 0.61–1.24)
GFR calc Af Amer: >60 mL/min (ref >60)
GFR calc non Af Amer: >60 mL/min (ref >60)
GLUCOSE: 130 mg/dL — AB (ref 65–99)
Potassium: 3.1 mmol/L — ABNORMAL LOW (ref 3.5–5.1)
Sodium: 142 mmol/L (ref 135–145)

## 2016-10-01 LAB — CULTURE, BLOOD (SINGLE)

## 2016-10-01 MED ORDER — VITAMIN E 15 UNIT/0.3ML PO SOLN: 100.0000 [IU] | Freq: Four times a day (QID) | ORAL | 0 refills | Status: AC

## 2016-10-01 MED ORDER — DOCUSATE SODIUM 50 MG/5ML PO LIQD: 50.0000 mg | Freq: Two times a day (BID) | ORAL | 0 refills | Status: AC

## 2016-10-01 MED ORDER — POLYETHYLENE GLYCOL 3350 17 G PO PACK: 17.0000 g | PACK | Freq: Every day | ORAL | 0 refills | Status: AC

## 2016-10-01 MED ORDER — OSMOLITE 1.5 CAL PO LIQD
237.0000 mL | Freq: Four times a day (QID) | ORAL | Status: DC
  Administered 2016-10-01 – 2016-10-02 (×7): 237 mL
  Filled 2016-10-01 (×8): qty 237

## 2016-10-01 MED ORDER — MAGIC MOUTHWASH W/LIDOCAINE: 5.0000 mL | Freq: Three times a day (TID) | ORAL | 0 refills | Status: DC | PRN

## 2016-10-01 MED ORDER — VITAMIN B-1 100 MG PO TABS: 100.0000 mg | ORAL_TABLET | Freq: Every day | ORAL | 0 refills | Status: AC

## 2016-10-01 MED ORDER — FENTANYL 12 MCG/HR TD PT72: 12.5000 ug | MEDICATED_PATCH | TRANSDERMAL | 0 refills | Status: DC

## 2016-10-01 MED ORDER — JEVITY 1.2 CAL PO LIQD: 1000.0000 mL | ORAL | Status: DC

## 2016-10-01 MED ORDER — ADULT MULTIVITAMIN LIQUID CH
15.0000 mL | Freq: Every day | ORAL | Status: DC
  Administered 2016-10-01 – 2016-10-02 (×2): 15 mL
  Filled 2016-10-01 (×3): qty 15

## 2016-10-01 MED ORDER — POTASSIUM CHLORIDE 20 MEQ/15ML (10%) PO SOLN
40.0000 meq | Freq: Four times a day (QID) | ORAL | Status: AC
  Administered 2016-10-01 (×2): 40 meq
  Filled 2016-10-01 (×2): qty 30

## 2016-10-01 MED ORDER — THIAMINE HCL 100 MG/ML IJ SOLN
100.0000 mg | Freq: Every day | INTRAMUSCULAR | Status: DC
  Administered 2016-10-01 – 2016-10-02 (×2): 100 mg via INTRAVENOUS
  Filled 2016-10-01 (×2): qty 2

## 2016-10-01 MED ORDER — SENNOSIDES 8.8 MG/5ML PO SYRP: 5.0000 mL | ORAL_SOLUTION | Freq: Two times a day (BID) | ORAL | 0 refills | Status: AC

## 2016-10-01 MED ORDER — OSMOLITE 1.5 CAL PO LIQD: 474.0000 mL | Freq: Four times a day (QID) | ORAL | Status: AC

## 2016-10-01 NOTE — Care Management Note (Signed)
Case Management Note  Patient Details  Name: Randy [name] MRN: [id number] Date of Birth: 25-Jun-1956  Subjective/Objective:   60 you admitted with Mucositis due to radiation.                 Action/Plan: Pt from home with wife. Per Dietitian and pt he will be discharging on bolus tube feeds. Pt states that he has been taught how to do the feeds himself and declines any HHRN follow up. [facility] DME rep contacted for tube feeding for home. Pt already has a tube feeding starter kit in the room. Will await MD orders for tube feeds for home.  Expected Discharge Date:   (unknown)               Expected Discharge Plan:  Home/Self Care  In-House Referral:     Discharge planning Services  CM Consult  Post Acute Care Choice:    Choice offered to:     DME Arranged:  Tube feeding DME Agency:  [place]:    [place] Agency:     Status of Service:  In process, will continue to follow  If discussed at Long Length of Stay Meetings, dates discussed:    Additional CommentsLynnell Catalan, RN 10/01/2016, 10:35 AM  [phone]

## 2016-10-01 NOTE — Discharge Instructions (Signed)
You may remove the honeycomb bandage on 10/03/16 and replace with a clean/dry dressing as needed. Ok to shower with your staples, but do not soak in the bath tub. We will remove your staples in our office at your appointment 10/09/16.

## 2016-10-01 NOTE — Progress Notes (Signed)
Patient tolerated TF boluses,no c/o n/v. Was able to perform TF without difficulty. Endorsed to  Hal Neer.

## 2016-10-01 NOTE — Progress Notes (Signed)
  Radiation Oncology         [phone] ________________________________  Name: Randy [name] MRN: [id number]  Date: 09/25/2016  DOB: 07-Apr-1956  Chart Note:  I checked in with the patient today.  He is day #6 admission for supportive care.  He has been afebrile on empiric antibiotics with negative cultures.  He is post-op day #3 s/p surgical PEG tube placement.  He is tolerating advancing tube feeds.  At this time, I would recommend transition to outpatient as soon as his care can be transitioned to home services.  With regard to his oral cancer, we are making radiation plan adjustments and will plan to restart treatment on 12/26.  ________________________________  Sheral Apley. Tammi Klippel, M.D.

## 2016-10-01 NOTE — Discharge Summary (Signed)
Physician Discharge Summary  Randy [name] Oncale [id number] DOB: 1955-10-19 DOA: 09/25/2016  PCP: Kennon Holter, MD  Admit date: 09/25/2016 Discharge date: 10/02/16  Admitted From: Home Disposition:  Home  Recommendations for Outpatient Follow-up:  1. Follow up with PCP in 1-2 weeks 2. Please obtain BMP/CBC, mag, phos on 10/06/16   Home Health:Yes Equipment/Devices:RN, enteral feeding  Discharge Condition:Stable CODE STATUS:FULL Diet recommendation: 2 cans of Osmolite 1.5 QID (0800, 1200, 1600, 2000). Flush with free water 30 ml before and after each bolus.   Brief/Interim Summary: 60 y.o.malewith medical history significant of tongue SCC, anxiety, CKD, mucositis, hyperlipidemia, hypertension, gout. Symptoms started about two weeks ago with oral pain secondary to XRT. Initially pain was controlled with extra strength Tylenol, but started to worsen last weekend prior to admission. The patient was seen by his primary care physician who started him on oxycodone and OxyContin for pain which did not help. The patient has been using Mugard, which has helped.  However he continued to have minimal oral intake secondary to oral mucositis and pain. On the night before admission, the patient had a fever up to 101.29F. As a result, the patient was seen by his medical oncologist on 09/25/2016 and recommended admission for dehydration and uncontrolled pain secondary to his mucositis.  Prior to admission, the patient had a previously scheduled for G- tube placement for 10/01/2016. Patient underwent a left partial glossectomy with neck dissection on 07/06/2016. He is currently in the midst of radiation treatments on a daily basis Monday through Friday. This has resulted in his severe mucositis and decreased oral intake. He is only to half of his radiation treatments. With his dehydration and malnutrition and anticipated continue oral radiation, the patient was admitted for rehydration and gastrostomy  tube placement to provide enteral nutrition. IR was consulted for percutaneous gastrostomy tube placement. However after CT of abdomen and pelvis revealed that there was unfavorable anatomy, general surgery was consulted for open gastrostomy tube placement.  This was performed on 09/28/2016. There was no immediate postoperative complications. Nutrition was consulted to assist with enteral feedings. Because of the patient's malnutrition, he was at high risk for refeeding syndrome. As a result, the patient's discharge was delayed to monitor his electrolytes and possible complications of refeeding syndrome.  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 Corliss Skains of [place] in 06/2016 -He is halfway through XRT -Rad onc consulted, XRT will be on hold till 12/26  Cancer pain: Pain is better but now has pain at surgical site -start fentanyl 12.5 mcg TD as OxyContin cannot be crushed -can still use oxycodone IR for breakthrough pain  Severe malnutrition in context of chronic illness, Pt has lost 28 lb since September (8% wt loss x 3 months, significant for time  frame).  Malnutritionrelated to dysphagia, mouth pain, chronic illnessas evidenced by  percent weight loss, energy intake < 75% for > or equal to 1 month. Nutrition input appreciated. ---Enteral feeding---> 2 cans of Osmolite 1.5 QID (0800, 1200, 1600, 2000). Flush with free water 30 ml before and after each bolus. Recommend additional free water flushes of 240 ml QID (960 ml total) -Because of high risk of refeeding syndrome in the context of severe malnutrition, the patient's discharge was delayed until 10/02/2016  -home with thiamine 100 mg daily  Morbid Obesity: Body mass index is 43.14 kg/m  Constipation:stool regimen  Hypokalemia -replete -checking mag  HTN D/c lisinopril/HCTZ-->BP has remained stable/controlled-->will not restart after d/c    Discharge Instructions   Allergies as of 10/01/2016   No Known Allergies     Medication List    STOP taking these medications   chlorthalidone 25 MG tablet Commonly known as:  HYGROTON   lisinopril 40 MG tablet Commonly known as:  PRINIVIL,ZESTRIL     TAKE these medications   allopurinol 300 MG tablet Commonly known as:  ZYLOPRIM Take 300 mg by mouth daily.   ALPRAZolam 0.5 MG tablet Commonly known as:  XANAX Take 0.5 mg by mouth daily.   atorvastatin 40 MG tablet Commonly known as:  LIPITOR Take 40 mg by mouth daily.   citalopram 20 MG tablet Commonly known as:  CELEXA Take 20 mg by mouth daily.   docusate 50 MG/5ML liquid Commonly known as:  COLACE Place 5 mLs (50 mg total) into feeding tube 2 (two) times daily.   ergocalciferol 50000 units capsule Commonly known as:  VITAMIN D2 Take 50,000 Units by mouth every Monday.   feeding supplement (OSMOLITE 1.5 CAL) Liqd Place 474 mLs into feeding tube 4 (four) times daily. Start taking on:  10/02/2016   fenofibrate 160 MG tablet Take 160 mg by mouth daily.   fentaNYL 12 MCG/HR Commonly known as:  DURAGESIC - dosed mcg/hr Place 1 patch (12.5 mcg  total) onto the skin every 3 (three) days. Start taking on:  10/03/2016   lidocaine 2 % solution Commonly known as:  XYLOCAINE Using new clean q-tip swab, paint the lips up to 4 times daily before oral intake   magic mouthwash w/lidocaine Soln Take 5 mLs by mouth 3 (three) times daily as needed for mouth pain.   MUGARD Liqd Use as directed 5 mLs in the mouth or throat every 4 (four) hours as needed. What changed:  reasons to take this   MULTIVITAMIN ADULT PO Take by mouth.   oxyCODONE 5 MG immediate release tablet Commonly known as:  Oxy IR/ROXICODONE Take 1 tablet (5 mg total) by mouth every 4 (four) hours as needed for severe pain. What changed:  Another medication with the same name was removed. Continue taking this medication, and follow the directions you see here.   polyethylene glycol packet Commonly known as:  MIRALAX / GLYCOLAX Take 17 g by mouth daily. Start taking on:  10/02/2016   sennosides 8.8 MG/5ML syrup Commonly known as:  SENOKOT Place 5 mLs into feeding tube 2 (two) times daily.   SONAFINE EX Apply 1 application topically 2 (two) times daily as needed (irritation/wound care).   thiamine 100 MG tablet Commonly known as:  VITAMIN B-1 Take 1 tablet (100 mg total) by mouth daily.   vitamin e 15 UNIT/0.3ML Soln solution Commonly known as:  AQUASOL E Take 2 mLs (100 Units total) by mouth 4 (four) times daily.            Durable Medical Equipment  Start     Ordered   10/01/16 1043  For home use only DME Tube feeding  Once    Comments:  Goal rate: Osmolite 1.5, 8 cans daily. Provide 2 cans of Osmolite 1.5 QID (0800, 1200, 1600, 2000). Flush with free water 30 ml before and after each bolus. Recommend additional free water flushes of 240 ml QID (960 ml total) This will provide 2840 kcal (101% of needs), 120g protein (92% of needs), and 2648 ml H2O.   10/01/16 1042     Follow-up [facility]. Go on 10/09/2016.    Specialty:  General Surgery Why:  Your appointment is 10/09/16 at 9:15am with a nurse to get your staples removed. Please arrive 20 minutes prior to your appointment to fill out necessary paperwork. Contact information: [address] [place] [phone]         No Known Allergies  Consultations:  RadOnc--[facility]  General Surgery--[facility]  IR   Procedures/Studies: Ct Abdomen Wo Contrast  Addendum Date: 09/26/2016   ADDENDUM REPORT: 09/26/2016 13:27 ADDENDUM: The CT examination was performed for assessment of anatomy prior to potential percutaneous gastrostomy tube placement secondary to oral mucositis from radiation therapy to treat tongue carcinoma. The study shows unfavorable anatomy for gastrostomy tube placement with a high stomach present and a high transverse colon which lies anterior to the majority of the body of the stomach. Surgical consultation is recommended to determine if the patient would be a candidate for surgical gastrostomy or jejunostomy. Electronically Signed   By: Aletta Edouard M.D.   On: 09/26/2016 13:27   Result Date: 09/26/2016 CLINICAL DATA:  Evaluate for G-tube placement. EXAM: CT ABDOMEN WITHOUT CONTRAST TECHNIQUE: Multidetector CT imaging of the abdomen was performed following the standard protocol without IV contrast. COMPARISON:  None. FINDINGS: Lower chest: Heart is borderline in size. Linear atelectasis or scarring in the lung bases. No effusions. Hepatobiliary: No visible focal hepatic abnormality. High-density layering material in the gallbladder could reflect small layering stones. Pancreas: No focal abnormality or ductal dilatation. Spleen: No focal abnormality.  Normal size. Adrenals/Urinary Tract: Bilateral perinephric stranding. No hydronephrosis. Low-density areas in the kidneys bilaterally likely reflects cysts. Adrenal glands unremarkable. Stomach/Bowel: Stomach, visualized large and small bowel  decompressed and unremarkable. Vascular/Lymphatic: Aortic and proximal common iliac calcifications. No aneurysm. No adenopathy. Other: No free fluid or free air. Musculoskeletal: No acute bony abnormality or focal bone lesion. IMPRESSION: Mild bilateral perinephric stranding. No hydronephrosis. This likely is related to of prior insults. Bilateral renal cysts. No acute findings in the abdomen or pelvis. Aortoiliac atherosclerosis. Electronically Signed: By: Rolm Baptise M.D. On: 09/26/2016 09:21   Dg Chest 1 View  Result Date: 09/26/2016 CLINICAL DATA:  Fever EXAM: CHEST 1 VIEW COMPARISON:  None. FINDINGS: Cardiomegaly. No confluent airspace opacities, effusions or edema. No acute bony abnormality. IMPRESSION: Cardiomegaly.  No active disease. Electronically Signed   By: Rolm Baptise M.D.   On: 09/26/2016 14:45        Discharge Exam: Vitals:   10/01/16 0419 10/01/16 1413  BP: (!) 131/49 (!) 117/47  Pulse: (!) 58 68  Resp: 16 18  Temp: 98.6 F (37 C) 98.2 F (36.8 C)   Vitals:   09/30/16 2006 10/01/16 0419 10/01/16 0555 10/01/16 1413  BP: (!) 133/56 (!) 131/49  (!) 117/47  Pulse: (!) 58 (!) 58  68  Resp: 16 16  18   Temp: 98.5 F (36.9 C) 98.6 F (37  C)  98.2 F (36.8 C)  TempSrc: Oral Oral  Oral  SpO2: 98% 94%  95%  Weight:   (!) 136.3 kg (300 lb 8 oz)   Height:        General: Pt is alert, awake, not in acute distress Cardiovascular: RRR, S1/S2 +, no rubs, no gallops Respiratory: CTA bilaterally, no wheezing, no rhonchi Abdominal: Soft, surgical site pain, ND, bowel sounds +; no drainage or erythema at surgical site Extremities: Trace LE edema, no cyanosis   The results of significant diagnostics from this hospitalization (including imaging, microbiology, ancillary and laboratory) are listed below for reference.    Significant Diagnostic Studies: Ct Abdomen Wo Contrast  Addendum Date: 09/26/2016   ADDENDUM REPORT: 09/26/2016 13:27 ADDENDUM: The CT examination was  performed for assessment of anatomy prior to potential percutaneous gastrostomy tube placement secondary to oral mucositis from radiation therapy to treat tongue carcinoma. The study shows unfavorable anatomy for gastrostomy tube placement with a high stomach present and a high transverse colon which lies anterior to the majority of the body of the stomach. Surgical consultation is recommended to determine if the patient would be a candidate for surgical gastrostomy or jejunostomy. Electronically Signed   By: Aletta Edouard M.D.   On: 09/26/2016 13:27   Result Date: 09/26/2016 CLINICAL DATA:  Evaluate for G-tube placement. EXAM: CT ABDOMEN WITHOUT CONTRAST TECHNIQUE: Multidetector CT imaging of the abdomen was performed following the standard protocol without IV contrast. COMPARISON:  None. FINDINGS: Lower chest: Heart is borderline in size. Linear atelectasis or scarring in the lung bases. No effusions. Hepatobiliary: No visible focal hepatic abnormality. High-density layering material in the gallbladder could reflect small layering stones. Pancreas: No focal abnormality or ductal dilatation. Spleen: No focal abnormality.  Normal size. Adrenals/Urinary Tract: Bilateral perinephric stranding. No hydronephrosis. Low-density areas in the kidneys bilaterally likely reflects cysts. Adrenal glands unremarkable. Stomach/Bowel: Stomach, visualized large and small bowel decompressed and unremarkable. Vascular/Lymphatic: Aortic and proximal common iliac calcifications. No aneurysm. No adenopathy. Other: No free fluid or free air. Musculoskeletal: No acute bony abnormality or focal bone lesion. IMPRESSION: Mild bilateral perinephric stranding. No hydronephrosis. This likely is related to of prior insults. Bilateral renal cysts. No acute findings in the abdomen or pelvis. Aortoiliac atherosclerosis. Electronically Signed: By: Rolm Baptise M.D. On: 09/26/2016 09:21   Dg Chest 1 View  Result Date: 09/26/2016 CLINICAL  DATA:  Fever EXAM: CHEST 1 VIEW COMPARISON:  None. FINDINGS: Cardiomegaly. No confluent airspace opacities, effusions or edema. No acute bony abnormality. IMPRESSION: Cardiomegaly.  No active disease. Electronically Signed   By: Rolm Baptise M.D.   On: 09/26/2016 14:45     Microbiology: Recent Results (from the past 240 hour(s))  Urine Culture     Status: None   Collection Time: 09/25/16  1:19 PM  Result Value Ref Range Status   Urine Culture, Routine Final report  Final   Urine Culture result 1 No growth  Final  Culture, Blood     Status: None   Collection Time: 09/25/16  1:21 PM  Result Value Ref Range Status   BLOOD CULTURE, ROUTINE Final report  Final   RESULT 1 Comment  Final    Comment: No aerobic or anaerobic growth in five days.  Culture, Blood     Status: None   Collection Time: 09/25/16  1:22 PM  Result Value Ref Range Status   BLOOD CULTURE, ROUTINE Final report  Final   RESULT 1 Comment  Final    Comment:  No aerobic or anaerobic growth in five days.  Surgical PCR screen     Status: None   Collection Time: 09/28/16  2:41 AM  Result Value Ref Range Status   MRSA, PCR NEGATIVE NEGATIVE Final   Staphylococcus aureus NEGATIVE NEGATIVE Final    Comment:        The Xpert SA Assay (FDA approved for NASAL specimens in patients over 47 years of age), is one component of a comprehensive surveillance program.  Test performance has been validated by [facility] for patients greater than or equal to 67 year old. It is not intended to diagnose infection nor to guide or monitor treatment.      Labs: Basic Metabolic Panel:  Recent Labs Lab 09/25/16 0939  09/27/16 0346 09/28/16 0451 09/29/16 0340 09/30/16 0401 10/01/16 0402  NA 132*  < > 138 139 143 147* 142  K 4.7  < > 4.1 4.0 3.8 3.3* 3.1*  CL  --   < > 105 105 107 108 104  CO2 22  < > 25 26 28 30 31   GLUCOSE 102  < > 118* 126* 115* 122* 130*  BUN 65.1*  < > 37* 29* 18 13 18   CREATININE 2.8*  < > 1.36* 1.43*  1.11 1.07 1.09  CALCIUM 9.8  < > 8.9 9.0 8.8* 8.7* 8.3*  MG 2.2  --  2.0  --   --  1.7 1.8  < > = values in this interval not displayed. Liver Function Tests:  Recent Labs Lab 09/25/16 0939  AST 13  ALT 10  ALKPHOS 36*  BILITOT 0.73  PROT 7.1  ALBUMIN 3.4*   No results for input(s): LIPASE, AMYLASE in the last 168 hours. No results for input(s): AMMONIA in the last 168 hours. CBC:  Recent Labs Lab 09/25/16 0939 09/26/16 0353 09/27/16 0346 09/28/16 0451 09/29/16 0340 09/30/16 0401  WBC 4.8 4.0 3.2* 4.0 4.3 4.1  NEUTROABS 3.1  --  2.0 2.7 3.0 2.8  HGB 9.3* 8.7* 8.6* 8.4* 8.6* 8.5*  HCT 27.8* 26.1* 25.6* 25.8* 26.2* 26.1*  MCV 88.5 89.1 90.1 89.6 90.0 90.6  PLT 184 206 191 209 208 211   Cardiac Enzymes: No results for input(s): CKTOTAL, CKMB, CKMBINDEX, TROPONINI in the last 168 hours. BNP: Invalid input(s): POCBNP CBG:  Recent Labs Lab 09/30/16 2005 09/30/16 2359 10/01/16 0417 10/01/16 0806 10/01/16 1252  GLUCAP 120* 116* 130* 125* 150*    Time coordinating discharge:  Greater than 30 minutes  Signed:  Masin Shatto, DO [facility] Pager: [phone] 10/01/2016, 5:31 PM

## 2016-10-01 NOTE — Progress Notes (Signed)
Oncology Nurse Navigator Documentation  Visited Mr. Randy [name] [id number] to check on his well being.  He was sleeping. Spoke with his RN, Butch Penny, who reported he has successfully instilled bolus feedings/water flushes, has been ambulating in hall.  DC anticipated tomorrow. Will check on him tomorrow as able.  Gayleen Orem, RN, BSN, [place] [facility] Oncology Nurse [place] [facility] [place] [phone]

## 2016-10-01 NOTE — Progress Notes (Signed)
Nutrition Follow-up  DOCUMENTATION CODES:   Severe malnutrition in context of chronic illness, Morbid obesity  INTERVENTION:   Monitor magnesium, potassium, and phosphorus daily for at least 3 days, MD to replete as needed, as pt is at risk for refeeding syndrome given severe malnutrition and poor PO intake for >1 week. Now Low K.  Per patient request, will transition to bolus feeds of Osmolite 1.5.  Provide 1 can of Osmolite 1.5 QID. Please flush 30 ml before and after each bolus (240 ml). Recommend additional free water flushes of 240 ml QID (960 ml total) This will provide 1420 kcal (54% of needs), 60g protein(46% of needs), and 1924 ml H2O.  Goal rate: Osmolite 1.5, 8 cans daily. Provide 2 cans of Osmolite 1.5 QID (0800, 1200, 1600, 2000). Flush with free water 30 ml before and after each bolus. Recommend additional free water flushes of 240 ml QID (960 ml total) This will provide 2840 kcal (101% of needs), 120g protein (92% of needs), and 2648 ml H2O.  -Provide Liquid MVI -Recommend IV Thiamine 100 mg daily for at least 3 days given refeeding risk.  -Reviewed regimen and provided TF booklet to pt's wife. Wife appreciative of information.  RD to continue to follow   NUTRITION DIAGNOSIS:   Malnutrition related to dysphagia, mouth pain, chronic illness as evidenced by percent weight loss, energy intake < 75% for > or equal to 1 month.  Ongoing.  GOAL:   Patient will meet greater than or equal to 90% of their needs  Progressing.  MONITOR:   Labs, Weight trends, TF tolerance, I & O's  ASSESSMENT:   60 y.o. male with medical history significant of tongue cancer, anxiety, CKD, mucositis, hyperlipidemia, hypertension, gout. Symptoms started about two weeks ago with oral pain secondary to radioation therapy. Initially pain was controlled with extra strength Tylenol, but started to worsen last weekend. He was seen this week and started on oxycodone and Oxycontin for pain,  which have not helped much. He has been using Mugard, which has helped. He reports developing a fever last night to 101 degrees farenheit. He was seen today for follow-up and recommended for admission to treat severe mucositis with superinfection in addition to significant dehydration.  Patient in room with wife at bedside. Pt awake and very alert during this visit. Pt eager to go home and was upset that he may have to stay another day. Pt reports being upset that his PEG was set to drainage for "48 hours". Pt currently receiving Osmolite 1.5 @ 30 ml/hr and pt reports tolerating this. Attempted to explain why the TF is being slowly advanced but pt was upset and did not want to talk with RD at this time.   RD worked with RN to relay TF regimen and plan. Will transition to bolus feeds at half rate (1 can Osmolite 1.5 QID). Reviewed free water flushes with RN.  RD met with pt's wife and reviewed TF regimen. Provided TF booklet for reference and written regimen. Answered all questions. Wife appreciative of information.  Medications: Senokot syrup BID, Colace liquid BID, Miralax packet daily, D5-.9% NaCl infusion at 100 ml/hr-provides 408 kcal, Vitamin E solution QID, Magic Mouthwash w/ Lidocaine Labs reviewed: CBGs: 125-130 Low K Mg WNL  Diet Order:  Diet NPO time specified  Skin:  Reviewed, no issues  Last BM:  12/18  Height:   Ht Readings from Last 1 Encounters:  09/25/16 _0  (1.803 m)    Weight:   Wt Readings  from Last 1 Encounters:  10/01/16 (!) 300 lb 8 oz (136.3 kg)    Ideal Body Weight:  78.2 kg  BMI:  Body mass index is 41.91 kg/m.  Estimated Nutritional Needs:   Kcal:  2600-2800  Protein:  130-150g  Fluid:  2.8L/day  EDUCATION NEEDS:   Education needs addressed  Randy Bibles, MS, RD, LDN Pager: [phone] After Hours Pager: [phone]

## 2016-10-01 NOTE — Progress Notes (Signed)
Patient ID: Randy [name], male   DOB: 1956-04-28, 60 y.o.   MRN: [id number]  [facility] Surgery Progress Note  3 Days Post-Op  Subjective: Feeling well today, hoping to go home. Tolerated first bolus tube feed. Denies n/v.  Objective: Vital signs in last 24 hours: Temp:  [98.5 F (36.9 C)-99 F (37.2 C)] 98.6 F (37 C) (12/21 0419) Pulse Rate:  [58-60] 58 (12/21 0419) Resp:  [16-18] 16 (12/21 0419) BP: (131-141)/(44-56) 131/49 (12/21 0419) SpO2:  [93 %-98 %] 94 % (12/21 0419) Weight:  [300 lb 8 oz (136.3 kg)] 300 lb 8 oz (136.3 kg) (12/21 0555) Last BM Date: 09/28/16  Intake/Output from previous day: 12/20 0701 - 12/21 0700 In: 1237.3 [I.V.:1200; NG/GT:37.3] Out: 2000 [Drains:2000] Intake/Output this shift: No intake/output data recorded.  PE: Gen:  Alert, NAD, pleasant Pulm:  CTAB, no W/R/R, effort normal Abd: Soft, NT/ND, +BS, no HSM, incision C/D/I with staples intact and honeycomb dressing in place, G-tube site clean with no drainage  Lab Results:   Recent Labs  09/29/16 0340 09/30/16 0401  WBC 4.3 4.1  HGB 8.6* 8.5*  HCT 26.2* 26.1*  PLT 208 211   BMET  Recent Labs  09/30/16 0401 10/01/16 0402  NA 147* 142  K 3.3* 3.1*  CL 108 104  CO2 30 31  GLUCOSE 122* 130*  BUN 13 18  CREATININE 1.07 1.09  CALCIUM 8.7* 8.3*   PT/INR No results for input(s): LABPROT, INR in the last 72 hours. CMP     Component Value Date/Time   NA 142 10/01/2016 0402   NA 132 (L) 09/25/2016 0939   K 3.1 (L) 10/01/2016 0402   K 4.7 09/25/2016 0939   CL 104 10/01/2016 0402   CO2 31 10/01/2016 0402   CO2 22 09/25/2016 0939   GLUCOSE 130 (H) 10/01/2016 0402   GLUCOSE 102 09/25/2016 0939   BUN 18 10/01/2016 0402   BUN 65.1 (H) 09/25/2016 0939   CREATININE 1.09 10/01/2016 0402   CREATININE 2.8 (H) 09/25/2016 0939   CALCIUM 8.3 (L) 10/01/2016 0402   CALCIUM 9.8 09/25/2016 0939   PROT 7.1 09/25/2016 0939   ALBUMIN 3.4 (L) 09/25/2016 0939   AST 13 09/25/2016  0939   ALT 10 09/25/2016 0939   ALKPHOS 36 (L) 09/25/2016 0939   BILITOT 0.73 09/25/2016 0939   GFRNONAA >60 10/01/2016 0402   GFRAA >60 10/01/2016 0402   Lipase  No results found for: LIPASE     Studies/Results: No results found.  Anti-infectives: Anti-infectives    Start     Dose/Rate Route Frequency Ordered Stop   09/28/16 0600  cefoTEtan (CEFOTAN) 2 g in dextrose 5 % 50 mL IVPB     2 g 100 mL/hr over 30 Minutes Intravenous On call to O.R. 09/27/16 0332 09/28/16 1230   09/27/16 1000  fluconazole (DIFLUCAN) IVPB 100 mg     100 mg 50 mL/hr over 60 Minutes Intravenous Every 24 hours 09/26/16 0942 09/30/16 1116   09/27/16 0600  cefoTEtan (CEFOTAN) 2 g in dextrose 5 % 50 mL IVPB  Status:  Discontinued     2 g 100 mL/hr over 30 Minutes Intravenous On call to O.R. 09/26/16 1417 09/27/16 0332   09/26/16 1400  Ampicillin-Sulbactam (UNASYN) 3 g in sodium chloride 0.9 % 100 mL IVPB     3 g 200 mL/hr over 30 Minutes Intravenous Every 6 hours 09/26/16 1020     09/26/16 1000  fluconazole (DIFLUCAN) IVPB 200 mg  200 mg 100 mL/hr over 60 Minutes Intravenous  Once 09/26/16 0942 09/26/16 1115   09/26/16 0000  Ampicillin-Sulbactam (UNASYN) 3 g in sodium chloride 0.9 % 100 mL IVPB  Status:  Discontinued     3 g 200 mL/hr over 30 Minutes Intravenous Every 8 hours 09/25/16 1643 09/26/16 1020   09/25/16 1630  Ampicillin-Sulbactam (UNASYN) 3 g in sodium chloride 0.9 % 100 mL IVPB     3 g 200 mL/hr over 30 Minutes Intravenous STAT 09/25/16 1609 09/25/16 1713       Assessment/Plan 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 Marlou Starks - POD 3 - tolerated first bolus of Osmolite 1.5 today.   FEN - NPO, TF (today: Provide 1 can of Osmolite 1.5 QID; goal: Osmolite 1.5, 8 cans daily) VTE - lovenox  Plan - Continue G-tube feeds and advance to goal. Will need staples out at 10-14 days  postop.   LOS: 6 days    Jerrye Beavers , [facility] Surgery 10/01/2016, 1:01 PM Pager: [phone] Consults: [phone] Mon-Fri 7:00 am-4:30 pm Sat-Sun 7:00 am-11:30 am

## 2016-10-02 ENCOUNTER — Ambulatory Visit: Payer: Self-pay | Admitting: Nurse Practitioner

## 2016-10-02 ENCOUNTER — Encounter: Payer: Managed Care, Other (non HMO) | Admitting: Nutrition

## 2016-10-02 ENCOUNTER — Ambulatory Visit: Payer: Managed Care, Other (non HMO)

## 2016-10-02 DIAGNOSIS — K1230 Oral mucositis (ulcerative), unspecified: Secondary | ICD-10-CM

## 2016-10-02 DIAGNOSIS — C020 Malignant neoplasm of dorsal surface of tongue: Secondary | ICD-10-CM

## 2016-10-02 DIAGNOSIS — E785 Hyperlipidemia, unspecified: Secondary | ICD-10-CM

## 2016-10-02 DIAGNOSIS — K1233 Oral mucositis (ulcerative) due to radiation: Principal | ICD-10-CM

## 2016-10-02 DIAGNOSIS — N183 Chronic kidney disease, stage 3 (moderate): Secondary | ICD-10-CM

## 2016-10-02 DIAGNOSIS — E860 Dehydration: Secondary | ICD-10-CM

## 2016-10-02 DIAGNOSIS — R1312 Dysphagia, oropharyngeal phase: Secondary | ICD-10-CM

## 2016-10-02 DIAGNOSIS — N170 Acute kidney failure with tubular necrosis: Secondary | ICD-10-CM

## 2016-10-02 DIAGNOSIS — I10 Essential (primary) hypertension: Secondary | ICD-10-CM

## 2016-10-02 LAB — BASIC METABOLIC PANEL
ANION GAP: 5 (ref 5–15)
BUN: 15 mg/dL (ref 6–20)
CALCIUM: 8.3 mg/dL — AB (ref 8.9–10.3)
CHLORIDE: 102 mmol/L (ref 101–111)
CO2: 30 mmol/L (ref 22–32)
Creatinine, Ser: 0.95 mg/dL (ref 0.61–1.24)
GFR calc non Af Amer: >60 mL/min (ref >60)
GLUCOSE: 101 mg/dL — AB (ref 65–99)
POTASSIUM: 3.4 mmol/L — AB (ref 3.5–5.1)
Sodium: 137 mmol/L (ref 135–145)

## 2016-10-02 LAB — GLUCOSE, CAPILLARY
GLUCOSE-CAPILLARY: 100 mg/dL — AB (ref 65–99)
GLUCOSE-CAPILLARY: 107 mg/dL — AB (ref 65–99)
GLUCOSE-CAPILLARY: 98 mg/dL (ref 65–99)
GLUCOSE-CAPILLARY: 99 mg/dL (ref 65–99)
Glucose-Capillary: 140 mg/dL — ABNORMAL HIGH (ref 65–99)

## 2016-10-02 LAB — MAGNESIUM: Magnesium: 1.7 mg/dL (ref 1.7–2.4)

## 2016-10-02 LAB — PHOSPHORUS: PHOSPHORUS: 3.3 mg/dL (ref 2.5–4.6)

## 2016-10-02 MED ORDER — POTASSIUM CHLORIDE 10 MEQ/100ML IV SOLN
10.0000 meq | INTRAVENOUS | Status: AC
Start: 2016-10-02 — End: 2016-10-02
  Administered 2016-10-02 (×3): 10 meq via INTRAVENOUS
  Filled 2016-10-02 (×3): qty 100

## 2016-10-02 MED ORDER — SODIUM CHLORIDE 0.9 % IV SOLN: 30.0000 meq | Freq: Once | INTRAVENOUS | Status: DC

## 2016-10-02 MED ORDER — MAGNESIUM SULFATE 4 GM/100ML IV SOLN
4.0000 g | Freq: Once | INTRAVENOUS | Status: AC
  Administered 2016-10-02: 4 g via INTRAVENOUS
  Filled 2016-10-02: qty 100

## 2016-10-02 MED ORDER — OXYCODONE HCL 5 MG/5ML PO SOLN: 5.0000 mg | ORAL | 0 refills | Status: DC | PRN

## 2016-10-02 MED ORDER — CITALOPRAM HYDROBROMIDE 10 MG/5ML PO SOLN: 20.0000 mg | Freq: Every day | ORAL | 3 refills | Status: AC

## 2016-10-02 MED ORDER — POTASSIUM CHLORIDE 2 MEQ/ML IV SOLN
INTRAVENOUS | Status: DC
  Administered 2016-10-02: 10:00:00 via INTRAVENOUS
  Filled 2016-10-02 (×2): qty 1000

## 2016-10-02 NOTE — Progress Notes (Signed)
PROGRESS NOTE    Randy [name]  [id number] DOB: January 09, 1956 DOA: 09/25/2016 PCP: Kennon Holter, MD    Brief Narrative:  60 y.o.malewith medical history significant of tongue SCC, anxiety, CKD, mucositis, hyperlipidemia, hypertension, gout. Symptoms started about two weeks ago with oral pain secondary to XRT. Initially pain was controlled with extra strength Tylenol, but started to worsen last weekend prior to admission. The patient was seen by his primary care physician who started him on oxycodone and OxyContin for pain which have not helped. The patient has been using Mugard, which has helped.  On the night before admission, the patient had a fever up to 101.83F. As a result, the patient was seen by his medical oncologist on 09/25/2016 and recommended admission for dehydration and uncontrolled pain secondary to his mucositis.  Prior to admission, the patient had a previously scheduled for chest tube placement for 10/01/2016. Patient underwent a left partial glossectomy with neck dissection on 07/06/2016. He is currently in the midst of radiation treatments on a daily basis Monday through Friday. I was consulted for percutaneous gastrostomy tube placement. However after CT of abdomen and pelvis revealed that there was unfavorable anatomy, general surgery was consulted for open gastrostomy tube placement.  This was performed on 09/28/2016.   Assessment & Plan:   Principal Problem:   Mucositis due to radiation therapy Active Problems:   Hyperlipidemia   Gout   Obesity   Essential hypertension   Cancer of dorsal tongue (HCC)   Chronic renal insufficiency   Acute on chronic kidney failure (HCC)   Chronic kidney disease, stage 3   Dysphagia, oropharyngeal - severe   Dysphagia  Severe mucositis of the mouth with dysphagia -Patient status post 7 days of Unasyn and 7 days of fluconazole -Status post open gastrostomy tube placement 09/28/2016-per Dr. Marlou Starks. -Appreciate general  surgery -Started on enteral feeding 09/30/2016 with assistance of dietitian, which patient is tolerating at this time. Patient preferring bolus feeding and as such has been started on bolus feeds. Blood cultures negative. Patient's radiation scheduled to resume on 10/06/2016.  -Outpatient follow-up.  Acute on chronic renal failure--CKD stage II-III -secondary to volume depletion -Serum creatinine peaked to 2.80 -Improved with hydration and holding ACE inhibitor and diuretic. -Baseline creatinine 1.1-1.3 -Creatinine on discharge was 0.95.   Invasive squamous cell carcinoma of the Tongue, T2N1 -Prior alcohol use and smoker s-/p triple endoscopy, left partial glossectomy with primary closure and left neck dissection levels I through IV, in [place] with ENT Dr. Corliss Skains of [place] in 06/2016 -He is halfway through XRT -Rad onc consulted, XRT will be on hold till after Christmas  Cancer pain: Pain is better but now has pain at surgical site -Patient has been started on fentanyl TD as OxyContin cannot be crushed. Patient will be discharged home on liquid form of OxyContin for breakthrough pain.  Severe malnutrition in context of chronic illness, Pt has lost 28 lb since September (8% wt loss x 3 months, significant for time frame).  Malnutritionrelated to dysphagia, mouth pain, chronic illnessas evidenced by percent weight loss, energy intake < 75% for > or equal to 1 month. Nutrition input appreciated.  Morbid Obesity: Body mass index is 43.14 kg/m  Constipation:Continue current stool regimen    DVT prophylaxis: Lovenox Code Status: Full Family Communication: Updated patient and wife at bedside. Disposition Plan: Discharge home today.   Consultants:   Gen. surgery: Dr. Johney Maine 09/26/2016  Procedures:   CT abdomen and pelvis 09/26/2016  Chest x-ray 09/26/2016  Open insertion of gastrostomy tube per Dr. Marlou Starks III 09/28/2016  Antimicrobials:  IV Unasyn  09/25/2016>>>>> 10/01/2016 IV Cefotan 09/27/2016>>>>> 09/28/2016    Subjective: Patient sitting up at bedside. No nausea. No vomiting. No chest and. No shortness of breath. Patient anxious to go home.  Objective: Vitals:   10/01/16 1413 10/01/16 2112 10/02/16 0535 10/02/16 1427  BP: (!) 117/47 135/63 135/65 (!) 135/57  Pulse: 68 (!) 57 63 61  Resp: 18 20 20 20   Temp: 98.2 F (36.8 C) 98.9 F (37.2 C) 98.2 F (36.8 C) 98.2 F (36.8 C)  TempSrc: Oral Oral Oral Oral  SpO2: 95% 99% 97% 97%  Weight:   (!) 138.9 kg (306 lb 3.2 oz)   Height:        Intake/Output Summary (Last 24 hours) at 10/02/16 1538 Last data filed at 10/02/16 1446  Gross per 24 hour  Intake          1149.33 ml  Output                0 ml  Net          1149.33 ml   Filed Weights   09/25/16 1538 10/01/16 0555 10/02/16 0535  Weight: (!) 140.3 kg (309 lb 4.9 oz) (!) 136.3 kg (300 lb 8 oz) (!) 138.9 kg (306 lb 3.2 oz)    Examination:  General exam: Appears calm and comfortable  Respiratory system: Clear to auscultation. Respiratory effort normal. Cardiovascular system: S1 & S2 heard, RRR. No JVD, murmurs, rubs, gallops or clicks. No pedal edema. Gastrointestinal system: Abdomen is nondistended, soft and nontender. No organomegaly or masses felt. Normal bowel sounds heard.Surgical site c/d/i. PEG tube. Central nervous system: Alert and oriented. No focal neurological deficits. Extremities: Symmetric 5 x 5 power. Skin: No rashes, lesions or ulcers Psychiatry: Judgement and insight appear normal. Mood & affect appropriate.     Data Reviewed: I have personally reviewed following labs and imaging studies  CBC:  Recent Labs Lab 09/26/16 0353 09/27/16 0346 09/28/16 0451 09/29/16 0340 09/30/16 0401  WBC 4.0 3.2* 4.0 4.3 4.1  NEUTROABS  --  2.0 2.7 3.0 2.8  HGB 8.7* 8.6* 8.4* 8.6* 8.5*  HCT 26.1* 25.6* 25.8* 26.2* 26.1*  MCV 89.1 90.1 89.6 90.0 90.6  PLT 206 191 209 208 [id number]   Basic Metabolic  Panel:  Recent Labs Lab 09/27/16 0346 09/28/16 0451 09/29/16 0340 09/30/16 0401 10/01/16 0402 10/02/16 0409  NA 138 139 143 147* 142 137  K 4.1 4.0 3.8 3.3* 3.1* 3.4*  CL 105 105 107 108 104 102  CO2 25 26 28 30 31 30   GLUCOSE 118* 126* 115* 122* 130* 101*  BUN 37* 29* 18 13 18 15   CREATININE 1.36* 1.43* 1.11 1.07 1.09 0.95  CALCIUM 8.9 9.0 8.8* 8.7* 8.3* 8.3*  MG 2.0  --   --  1.7 1.8 1.7  PHOS  --   --   --   --   --  3.3   GFR: Estimated Creatinine Clearance: 116.3 mL/min (by C-G formula based on SCr of 0.95 mg/dL). Liver Function Tests: No results for input(s): AST, ALT, ALKPHOS, BILITOT, PROT, ALBUMIN in the last 168 hours. No results for input(s): LIPASE, AMYLASE in the last 168 hours. No results for input(s): AMMONIA in the last 168 hours. Coagulation Profile: No results for input(s): INR, PROTIME in the last 168 hours. Cardiac Enzymes: No results for input(s): CKTOTAL, CKMB, CKMBINDEX, TROPONINI in the last 168 hours. BNP (last 3 results)  No results for input(s): PROBNP in the last 8760 hours. HbA1C: No results for input(s): HGBA1C in the last 72 hours. CBG:  Recent Labs Lab 10/01/16 2059 10/02/16 0054 10/02/16 0537 10/02/16 0748 10/02/16 1219  GLUCAP 92 98 100* 99 107*   Lipid Profile: No results for input(s): CHOL, HDL, LDLCALC, TRIG, CHOLHDL, LDLDIRECT in the last 72 hours. Thyroid Function Tests: No results for input(s): TSH, T4TOTAL, FREET4, T3FREE, THYROIDAB in the last 72 hours. Anemia Panel:  Recent Labs  09/30/16 0401  VITAMINB12 301  FOLATE 21.7  TIBC 253  IRON 18*  RETICCTPCT 1.1   Sepsis Labs: No results for input(s): PROCALCITON, LATICACIDVEN in the last 168 hours.  Recent Results (from the past 240 hour(s))  Urine Culture     Status: None   Collection Time: 09/25/16  1:19 PM  Result Value Ref Range Status   Urine Culture, Routine Final report  Final   Urine Culture result 1 No growth  Final  Culture, Blood     Status: None    Collection Time: 09/25/16  1:21 PM  Result Value Ref Range Status   BLOOD CULTURE, ROUTINE Final report  Final   RESULT 1 Comment  Final    Comment: No aerobic or anaerobic growth in five days.  Culture, Blood     Status: None   Collection Time: 09/25/16  1:22 PM  Result Value Ref Range Status   BLOOD CULTURE, ROUTINE Final report  Final   RESULT 1 Comment  Final    Comment: No aerobic or anaerobic growth in five days.  Surgical PCR screen     Status: None   Collection Time: 09/28/16  2:41 AM  Result Value Ref Range Status   MRSA, PCR NEGATIVE NEGATIVE Final   Staphylococcus aureus NEGATIVE NEGATIVE Final    Comment:        The Xpert SA Assay (FDA approved for NASAL specimens in patients over 68 years of age), is one component of a comprehensive surveillance program.  Test performance has been validated by [facility] for patients greater than or equal to 61 year old. It is not intended to diagnose infection nor to guide or monitor treatment.          Radiology Studies: No results found.      Scheduled Meds: . allopurinol  300 mg Oral Daily  . ALPRAZolam  0.5 mg Oral Daily  . atorvastatin  40 mg Oral Daily  . citalopram  20 mg Oral Daily  . sennosides  5 mL Per Tube BID   And  . docusate  50 mg Per Tube BID  . enoxaparin (LOVENOX) injection  40 mg Subcutaneous Q24H  . feeding supplement (OSMOLITE 1.5 CAL)  237 mL Per Tube QID  . fenofibrate  160 mg Oral QAC breakfast  . fentaNYL  12.5 mcg Transdermal Q72H  . lip balm  1 application Topical BID  . multivitamin  15 mL Per Tube Daily  . polyethylene glycol  17 g Oral Daily  . potassium chloride  10 mEq Intravenous Q1 Hr x 3  . thiamine injection  100 mg Intravenous Daily  . Vitamin D (Ergocalciferol)  50,000 Units Oral Q Mon  . vitamin e  100 Units Oral QID   Continuous Infusions: . dextrose 5 % and 0.9% NaCl 1,000 mL with potassium chloride 40 mEq infusion 100 mL/hr at 10/02/16 0955     LOS: 7 days     Time spent: 10 mins    Kween Bacorn, MD  [facility] Pager [phone]  If 7PM-7AM, please contact night-coverage [email or web address] Password [facility] 10/02/2016, 3:38 PM

## 2016-10-02 NOTE — Progress Notes (Signed)
4 Days Post-Op  Subjective: No complaints. Tolerating tube feeds  Objective: Vital signs in last 24 hours: Temp:  [98.2 F (36.8 C)-98.9 F (37.2 C)] 98.2 F (36.8 C) (12/22 0535) Pulse Rate:  [57-68] 63 (12/22 0535) Resp:  [18-20] 20 (12/22 0535) BP: (117-135)/(47-65) 135/65 (12/22 0535) SpO2:  [95 %-99 %] 97 % (12/22 0535) Weight:  [138.9 kg (306 lb 3.2 oz)] 138.9 kg (306 lb 3.2 oz) (12/22 0535) Last BM Date: 09/28/16  Intake/Output from previous day: 12/21 0701 - 12/22 0700 In: 1544 [I.V.:770; NG/GT:674; IV Piggyback:100] Out: -  Intake/Output this shift: No intake/output data recorded.  Resp: clear to auscultation bilaterally Cardio: regular rate and rhythm GI: soft, nontender. incision looks good  Lab Results:   Recent Labs  09/30/16 0401  WBC 4.1  HGB 8.5*  HCT 26.1*  PLT 211   BMET  Recent Labs  10/01/16 0402 10/02/16 0409  NA 142 137  K 3.1* 3.4*  CL 104 102  CO2 31 30  GLUCOSE 130* 101*  BUN 18 15  CREATININE 1.09 0.95  CALCIUM 8.3* 8.3*   PT/INR No results for input(s): LABPROT, INR in the last 72 hours. ABG No results for input(s): PHART, HCO3 in the last 72 hours.  Invalid input(s): PCO2, PO2  Studies/Results: No results found.  Anti-infectives: Anti-infectives    Start     Dose/Rate Route Frequency Ordered Stop   09/28/16 0600  cefoTEtan (CEFOTAN) 2 g in dextrose 5 % 50 mL IVPB     2 g 100 mL/hr over 30 Minutes Intravenous On call to O.R. 09/27/16 0332 09/28/16 1230   09/27/16 1000  fluconazole (DIFLUCAN) IVPB 100 mg     100 mg 50 mL/hr over 60 Minutes Intravenous Every 24 hours 09/26/16 0942 09/30/16 1116   09/27/16 0600  cefoTEtan (CEFOTAN) 2 g in dextrose 5 % 50 mL IVPB  Status:  Discontinued     2 g 100 mL/hr over 30 Minutes Intravenous On call to O.R. 09/26/16 1417 09/27/16 0332   09/26/16 1400  Ampicillin-Sulbactam (UNASYN) 3 g in sodium chloride 0.9 % 100 mL IVPB  Status:  Discontinued     3 g 200 mL/hr over 30 Minutes  Intravenous Every 6 hours 09/26/16 1020 10/01/16 1536   09/26/16 1000  fluconazole (DIFLUCAN) IVPB 200 mg     200 mg 100 mL/hr over 60 Minutes Intravenous  Once 09/26/16 0942 09/26/16 1115   09/26/16 0000  Ampicillin-Sulbactam (UNASYN) 3 g in sodium chloride 0.9 % 100 mL IVPB  Status:  Discontinued     3 g 200 mL/hr over 30 Minutes Intravenous Every 8 hours 09/25/16 1643 09/26/16 1020   09/25/16 1630  Ampicillin-Sulbactam (UNASYN) 3 g in sodium chloride 0.9 % 100 mL IVPB     3 g 200 mL/hr over 30 Minutes Intravenous STAT 09/25/16 1609 09/25/16 1713      Assessment/Plan: s/p Procedure(s): OPEN INSERTION OF GASTROSTOMY TUBE (N/A) Advance diet. Continue tube feeds Ok for discharge from surgery standpoint.  Follow up in 1-2 weeks for staple removal  LOS: 7 days    TOTH III,PAUL S 10/02/2016

## 2016-10-02 NOTE — Progress Notes (Signed)
Assessment unchanged.  Scripts were given per MD order.  All questions pertaining to D/C we answered.  Pt was D/C'd via wheelchair and accompanied by RN.  Roselind Rily

## 2016-10-02 NOTE — Progress Notes (Signed)
Nutrition Brief Follow-Up Note  Followed up with pt today regarding TFs. Pt reports everything is going well and he feels comfortable doing the TFs at home. Told pt that goal is to get 8 cans Osmolite 1.5 per day; 2 cans QID. Pt reports that he understands. Pt understands about water flushes.   Goal rate: Osmolite 1.5, 8 cans daily. Provide 2 cans of Osmolite 1.5 QID (0800, 1200, 1600, 2000). Flush with free water 30 ml before and after each bolus. Recommend additional free water flushes of 240 ml QID (960 ml total) This will provide 2840 kcal (101% of needs), 120g protein (92% of needs), and 2648 ml H2O.  -Provide Liquid MVI  -RD reviewed regimen and provided TF booklet to pt's wife on 12/21. Wife appreciative of information.  RD to continue to follow    Koleen Distance, RD, LDN Pager #- [phone]

## 2016-10-06 ENCOUNTER — Ambulatory Visit
Admission: RE | Admit: 2016-10-06 | Discharge: 2016-10-06 | Disposition: A | Discharge status: Home / Self Care | Payer: Managed Care, Other (non HMO) | Source: Ambulatory Visit | Attending: Radiation Oncology | Admitting: Radiation Oncology

## 2016-10-06 DIAGNOSIS — Z510 Encounter for antineoplastic radiation therapy: Secondary | ICD-10-CM | POA: Diagnosis not present

## 2016-10-06 DIAGNOSIS — K1233 Oral mucositis (ulcerative) due to radiation: Secondary | ICD-10-CM | POA: Diagnosis not present

## 2016-10-06 DIAGNOSIS — N189 Chronic kidney disease, unspecified: Secondary | ICD-10-CM | POA: Diagnosis not present

## 2016-10-06 DIAGNOSIS — Z931 Gastrostomy status: Secondary | ICD-10-CM | POA: Diagnosis not present

## 2016-10-06 DIAGNOSIS — C020 Malignant neoplasm of dorsal surface of tongue: Secondary | ICD-10-CM | POA: Diagnosis not present

## 2016-10-06 DIAGNOSIS — M1712 Unilateral primary osteoarthritis, left knee: Secondary | ICD-10-CM | POA: Diagnosis not present

## 2016-10-06 DIAGNOSIS — Z800 Family history of malignant neoplasm of digestive organs: Secondary | ICD-10-CM | POA: Diagnosis not present

## 2016-10-06 DIAGNOSIS — I129 Hypertensive chronic kidney disease with stage 1 through stage 4 chronic kidney disease, or unspecified chronic kidney disease: Secondary | ICD-10-CM | POA: Diagnosis not present

## 2016-10-06 DIAGNOSIS — Z79899 Other long term (current) drug therapy: Secondary | ICD-10-CM | POA: Diagnosis not present

## 2016-10-06 DIAGNOSIS — Z87891 Personal history of nicotine dependence: Secondary | ICD-10-CM | POA: Diagnosis not present

## 2016-10-06 MED FILL — CITALOPRAM 10 MG/5 ML SOLUT: 10 | 24 days supply | Qty: 240 | Fill #0

## 2016-10-06 MED FILL — oxyCODONE HCL 5 MG/5ML SOLN: 5 | 6 days supply | Qty: 120 | Fill #0

## 2016-10-06 MED FILL — OxyCONTIN 20 MG T12A: 20 | 30 days supply | Qty: 60 | Fill #0

## 2016-10-07 ENCOUNTER — Ambulatory Visit
Admission: RE | Admit: 2016-10-07 | Discharge: 2016-10-07 | Disposition: A | Discharge status: Home / Self Care | Payer: Managed Care, Other (non HMO) | Source: Ambulatory Visit | Attending: Radiation Oncology | Admitting: Radiation Oncology

## 2016-10-07 ENCOUNTER — Ambulatory Visit: Payer: Managed Care, Other (non HMO) | Admitting: Nurse Practitioner

## 2016-10-07 DIAGNOSIS — Z510 Encounter for antineoplastic radiation therapy: Secondary | ICD-10-CM | POA: Diagnosis not present

## 2016-10-07 NOTE — Progress Notes (Signed)
Pt arrived to [facility] for IV fluids (scheduled prior to recent hospitalization). During hospitalization for very severe mucositis d/t XRT, pt had PEG tube placed surgically.  Pt now taking 8 cans/day of Osmolite 1.5 along with free water flushes before and after bolus feedings. Also able to take water in orally. He states he is drinking quite a lot of water. Not eating much as the food has no taste at this time.  He feels he is getting adequate nutrition via PEG tube at this time. He does not have any symptoms of dehydration. VSS. Skin turgor good. XRT has resumed as of yesterday. Pt did not have XRT while in the hospital. Spoke with Gayleen Orem, RN navigator. Discussed with him that pt does not need IV fluids today.  He agreed.  Pt does need to have appt with surgeon moved on Friday d/t timing of XRT. Liliane Channel stated he would take care of that. Pt has not had labs done since hospital discharge on 10/02/16. Liliane Channel also made aware of that. Spoke with pt and he is in agreement that he does not need fluids today or Friday. Will cancel appt for fluids for Friday.  Pt is aware of how to contact us should he need fluids or has symptoms that we can manage here . Pt has gone home after discussions.

## 2016-10-08 ENCOUNTER — Ambulatory Visit
Admission: RE | Admit: 2016-10-08 | Discharge: 2016-10-08 | Disposition: A | Discharge status: Home / Self Care | Payer: Managed Care, Other (non HMO) | Source: Ambulatory Visit | Attending: Radiation Oncology | Admitting: Radiation Oncology

## 2016-10-08 DIAGNOSIS — Z510 Encounter for antineoplastic radiation therapy: Secondary | ICD-10-CM | POA: Diagnosis not present

## 2016-10-09 ENCOUNTER — Ambulatory Visit
Admission: RE | Admit: 2016-10-09 | Discharge: 2016-10-09 | Disposition: A | Discharge status: Home / Self Care | Payer: Managed Care, Other (non HMO) | Source: Ambulatory Visit | Attending: Radiation Oncology | Admitting: Radiation Oncology

## 2016-10-09 ENCOUNTER — Telehealth: Payer: Self-pay | Admitting: Radiation Oncology

## 2016-10-09 ENCOUNTER — Encounter: Payer: Self-pay | Admitting: *Deleted

## 2016-10-09 ENCOUNTER — Ambulatory Visit: Payer: Self-pay | Admitting: Nurse Practitioner

## 2016-10-09 ENCOUNTER — Ambulatory Visit: Payer: Managed Care, Other (non HMO) | Admitting: Nutrition

## 2016-10-09 VITALS — BP 148/50 | HR 68 | Temp 98.3°F | Resp 18 | Wt 308.0 lb

## 2016-10-09 DIAGNOSIS — C020 Malignant neoplasm of dorsal surface of tongue: Secondary | ICD-10-CM

## 2016-10-09 DIAGNOSIS — Z510 Encounter for antineoplastic radiation therapy: Secondary | ICD-10-CM | POA: Diagnosis not present

## 2016-10-09 MED ORDER — OXYCODONE HCL 5 MG/5ML PO SOLN: 5.0000 mg | ORAL | 0 refills | Status: DC | PRN

## 2016-10-09 NOTE — Telephone Encounter (Signed)
Returned Photographer, Editor, commissioning, call from [place], [employer]. Provided her with clarification reference Oxycodone 5 mg liquid script. She reports now that she has the clarification needed she will fill the script for the patient.

## 2016-10-09 NOTE — Progress Notes (Signed)
Oncology Nurse Navigator Documentation  Met with Mr. Andujo during [place] with Dr. Lisbeth Renshaw. He reported:  Some tongue pain, throat soreness; using 12.5 mcg Fentanyl.  Mouth sores significantly reduced.  Applying Sonafine BID  Instilling 8 cans Osmolite daily, flushing PEG before and after use.  Drinking 6-8 oz bottles of water daily. Discussed bowel regime to avoid constipation. He understands I can be contacted with needs/concerns.  Gayleen Orem, RN, BSN, [place] Oncology Nurse [place] [facility] [place] [phone]

## 2016-10-09 NOTE — Progress Notes (Signed)
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 Current weight documented as 308 pounds on December 29 improved from 305.2 pounds, December 12. Patient is status post surgical feeding tube and is now tolerating 8 cans Osmolite 1.5 bolus feedings daily. Patient reports he uses 60 cc free water before bolus feeding and 120 cc after bolus feeding. In addition, he drinks 6 - 16 ounce bottles of water daily. Patient denies nutrition impact symptoms. He is taking a stool softener to help with bowel movements.  Nutrition diagnosis: Severe malnutrition improved.  Intervention:  Educated patient to continue to 2 cans Osmolite 1.5 four times a day with 60 cc free water before and 120 cc free water after bolus feedings. Tube feedings will provide 2840 cal, 120 g protein. Encouraged patient to continue swallowing by mouth and continue swallowing exercises. Questions were answered.  Teach back method was used.  Monitoring, evaluation, goals:  Patient will tolerate tube feeding to meet greater than 90% of estimated needs to minimize weight loss.  Next visit: Wednesday, January 3.  **Disclaimer: This note was dictated with voice recognition software. Similar sounding words can inadvertently be transcribed and this note may contain transcription errors which may not have been corrected upon publication of note.**

## 2016-10-09 NOTE — Progress Notes (Signed)
  Radiation Oncology         [phone]   Name: Randy [name] MRN: [id number]   Date: 10/09/2016  DOB: 03-Oct-1956     Weekly Radiation Therapy Management    ICD-9-CM ICD-10-CM   1. Cancer of dorsal tongue (HCC) 141.1 C02.0     Current Dose: 36.8 Gy  Planned Dose:  60 Gy  Narrative The patient presents for routine under treatment assessment.  Weight and vitals stable. He reports mild fatigue. The patient reports sore throat, gums, and tongue, tough he reports his mucositis is greatly improved and he is now able to swallow. His dry mouth continues, and he sips water often and uses Biotene to manage. He is instilling 8 cans of osmolite per day. Per nursing, the patient has faint hyperpigmentation without desquamation of anterior neck noted. He reports using Sonafine bid as directed. We are joined today by Randy Orem, RN.  Set-up films were reviewed. The chart was checked.  Physical Findings  weight is 308 lb (139.7 kg) (abnormal). His oral temperature is 98.3 F (36.8 C). His blood pressure is 148/50 (abnormal) and his pulse is 68. His respiration is 18 and oxygen saturation is 94%.  Weight loss of 18 lbs since 08/24/16. Alert, in no acute distress. Moderate mucositis in the oral cavity.  Impression The patient is tolerating radiation.  Plan We will continue treatment as planned. I refilled the patient's Oxycodone.   ------------------------------------------------  Randy Gross, MD, PhD  This document serves as a record of services personally performed by Randy Rudd, MD. It was created on his behalf by Randy [name], a trained medical scribe. The creation of this record is based on the scribe's personal observations and the provider's statements to them. This document has been checked and approved by the attending provider.

## 2016-10-09 NOTE — Progress Notes (Addendum)
Vitals and weight  stable. Reports sore throat, gums, and tongue but, denies pain.  Mucositis has greatly improved. Reports he is now able to swallowing. Reports instilling 8 cans of osmolite per day via PEG tube. Reports dry mouth continues. Reports he sips water often and uses Biotene to manage dry mouth. Hyperpigmentation without desquamation of anterior neck noted. Reports using Sonafine bid as directed on skin within treatment field. Reports mild fatigue. Requesting refill of Oxycodone 5 mg solution. Scheduled to follow up with dietician following this PUT.   BP (!) 148/50 (BP Location: Right Arm, Patient Position: Sitting, Cuff Size: Normal)   Pulse 68   Temp 98.3 F (36.8 C) (Oral)   Resp 18   Wt (!) 308 lb (139.7 kg)   SpO2 94%   BMI 42.96 kg/m  Wt Readings from Last 3 Encounters:  10/09/16 (!) 308 lb (139.7 kg)  10/07/16 (!) 305 lb 11.2 oz (138.7 kg)  10/02/16 (!) 306 lb 3.2 oz (138.9 kg)

## 2016-10-13 ENCOUNTER — Ambulatory Visit
Admission: RE | Admit: 2016-10-13 | Discharge: 2016-10-13 | Disposition: A | Discharge status: Home / Self Care | Payer: Managed Care, Other (non HMO) | Source: Ambulatory Visit | Attending: Radiation Oncology | Admitting: Radiation Oncology

## 2016-10-13 DIAGNOSIS — Z510 Encounter for antineoplastic radiation therapy: Secondary | ICD-10-CM | POA: Diagnosis not present

## 2016-10-14 ENCOUNTER — Ambulatory Visit
Admission: RE | Admit: 2016-10-14 | Discharge: 2016-10-14 | Disposition: A | Discharge status: Home / Self Care | Payer: Managed Care, Other (non HMO) | Source: Ambulatory Visit | Attending: Radiation Oncology | Admitting: Radiation Oncology

## 2016-10-14 ENCOUNTER — Ambulatory Visit: Payer: Managed Care, Other (non HMO) | Admitting: Nutrition

## 2016-10-14 DIAGNOSIS — Z510 Encounter for antineoplastic radiation therapy: Secondary | ICD-10-CM | POA: Diagnosis not present

## 2016-10-14 NOTE — Progress Notes (Signed)
Nutrition follow-up completed with patient receiving radiation therapy for tonsil cancer. Weight was documented as 308 pounds December 29. Patient continues to tolerate Osmolite 1.5- 8 cans daily using bolus tube feeding administration. He uses 60 cc free water before bolus feeding and 120 cc after bolus feeding.  In addition patient continues to tolerate 6 - 16 ounce bottles of water daily by mouth. Patient denies constipation and reports stool is soft. He  usuallyhas a bowel movement every day.  Nutrition diagnosis: Severe malnutrition improved.  Intervention: Patient educated to continue 2 cans Osmolite 1.5 - 4 times a day with 60 cc free water before and 120 cc free water after bolus feedings. Tube feedings provide 2840 cal, 120 g protein. Enforced the importance of patient continuing swallowing exercises and swallowing by mouth. Questions were answered.  Teach back method used.  Monitoring, evaluation, goals: Patient is tolerating tube feeding to meet greater than 90% of estimated needs and is maintaining weight.  Next visit: Wednesday, January 10 after radiation therapy.  **Disclaimer: This note was dictated with voice recognition software. Similar sounding words can inadvertently be transcribed and this note may contain transcription errors which may not have been corrected upon publication of note.**

## 2016-10-15 ENCOUNTER — Ambulatory Visit
Admission: RE | Admit: 2016-10-15 | Discharge: 2016-10-15 | Disposition: A | Discharge status: Home / Self Care | Payer: Managed Care, Other (non HMO) | Source: Ambulatory Visit | Attending: Radiation Oncology | Admitting: Radiation Oncology

## 2016-10-15 DIAGNOSIS — Z510 Encounter for antineoplastic radiation therapy: Secondary | ICD-10-CM | POA: Diagnosis not present

## 2016-10-16 ENCOUNTER — Ambulatory Visit
Admission: RE | Admit: 2016-10-16 | Discharge: 2016-10-16 | Disposition: A | Discharge status: Home / Self Care | Payer: Managed Care, Other (non HMO) | Source: Ambulatory Visit | Attending: Radiation Oncology | Admitting: Radiation Oncology

## 2016-10-16 VITALS — BP 125/50 | HR 65 | Resp 18 | Wt 303.6 lb

## 2016-10-16 DIAGNOSIS — C020 Malignant neoplasm of dorsal surface of tongue: Secondary | ICD-10-CM

## 2016-10-16 DIAGNOSIS — Z510 Encounter for antineoplastic radiation therapy: Secondary | ICD-10-CM | POA: Diagnosis not present

## 2016-10-16 NOTE — Progress Notes (Signed)
Weight and vitals stable. Denies pain.

## 2016-10-16 NOTE — Progress Notes (Signed)
  Radiation Oncology         [phone]   Name: Randy [name] MRN: [id number]   Date: 10/16/2016  DOB: [birth year]     Weekly Radiation Therapy Management    ICD-9-CM ICD-10-CM   1. 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  The patient reports his mouth feels somewhat better, but his mucositis symptoms seem to be returning since restarting radiation. He has no other complaints at this time.  Set-up films were reviewed. The chart was checked.  Physical Findings Weight stable. Alert, in no acute distress. No evidence of thrush noted. Some white residue noted on the tongue.  Impression The patient is tolerating radiation.  Plan We will continue treatment as planned. I encouraged the patient to brush his tongue gently with his toothbrush a couple of times a week if possible without pain.   ------------------------------------------------   Tyler Pita, MD [place] Director and Director of Stereotactic Radiosurgery Direct Dial: [phone]  Fax: [phone] [email or web address]  Skype  LinkedIn  This document serves as a record of services personally performed by Tyler Pita, MD. It was created on his behalf by Maryla Morrow, a trained medical scribe. The creation of this record is based on the scribe's personal observations and the provider's statements to them. This document has been checked and approved by the attending provider.

## 2016-10-19 ENCOUNTER — Encounter: Payer: Managed Care, Other (non HMO) | Admitting: Nutrition

## 2016-10-19 ENCOUNTER — Ambulatory Visit
Admission: RE | Admit: 2016-10-19 | Discharge: 2016-10-19 | Disposition: A | Discharge status: Home / Self Care | Payer: Managed Care, Other (non HMO) | Source: Ambulatory Visit | Attending: Radiation Oncology | Admitting: Radiation Oncology

## 2016-10-19 DIAGNOSIS — Z510 Encounter for antineoplastic radiation therapy: Secondary | ICD-10-CM | POA: Diagnosis not present

## 2016-10-20 ENCOUNTER — Ambulatory Visit: Payer: Managed Care, Other (non HMO)

## 2016-10-20 ENCOUNTER — Ambulatory Visit
Admission: RE | Admit: 2016-10-20 | Discharge: 2016-10-20 | Disposition: A | Discharge status: Home / Self Care | Payer: Managed Care, Other (non HMO) | Source: Ambulatory Visit | Attending: Radiation Oncology | Admitting: Radiation Oncology

## 2016-10-20 DIAGNOSIS — Z510 Encounter for antineoplastic radiation therapy: Secondary | ICD-10-CM | POA: Diagnosis not present

## 2016-10-21 ENCOUNTER — Ambulatory Visit: Payer: Managed Care, Other (non HMO)

## 2016-10-21 ENCOUNTER — Ambulatory Visit: Payer: Managed Care, Other (non HMO) | Admitting: Nutrition

## 2016-10-21 ENCOUNTER — Ambulatory Visit
Admission: RE | Admit: 2016-10-21 | Discharge: 2016-10-21 | Disposition: A | Discharge status: Home / Self Care | Payer: Managed Care, Other (non HMO) | Source: Ambulatory Visit | Attending: Radiation Oncology | Admitting: Radiation Oncology

## 2016-10-21 DIAGNOSIS — Z510 Encounter for antineoplastic radiation therapy: Secondary | ICD-10-CM | POA: Diagnosis not present

## 2016-10-21 NOTE — Progress Notes (Signed)
Nutrition follow-up completed with patient receiving radiation therapy for tonsil cancer. Weight decreased and documented as 303.6 pounds on January 5, decreased from 308 pounds December 29. Patient is not eating food by mouth but continues to drink 6-8 bottles of water daily. He is tolerating 8 bottles of Osmolite 1.5 daily with 60 cc free water before and 120 cc free water after each feeding. Patient reports he has a BM daily. He denies nutrition impact symptoms. He continues to use baking soda saltwater rinses. Patient's final radiation therapy is scheduled for Jan 16.  Nutrition diagnosis: Severe malnutrition, improved.  Intervention: Patient educated to continue 2 cans of Osmolite 1.5 - 4 times a day with 60 cc free water before 120 cc free water after bolus feedings. Tube feeding provides 2840 cal and 120 g protein. Recommended patient continue to drink water by mouth and educated him on strategies for increasing oral intake as healing begins. Questions were answered.  Teach back method used. Patient encouraged to contact me for questions before next visit.  Monitoring, evaluation, goals:  Patient is tolerating tube feeding to meet greater than 90% of estimated nutrition needs.  Next visit: Monday, February 12.  **Disclaimer: This note was dictated with voice recognition software. Similar sounding words can inadvertently be transcribed and this note may contain transcription errors which may not have been corrected upon publication of note.**

## 2016-10-22 ENCOUNTER — Encounter: Payer: Self-pay | Admitting: *Deleted

## 2016-10-22 ENCOUNTER — Ambulatory Visit
Admission: RE | Admit: 2016-10-22 | Discharge: 2016-10-22 | Disposition: A | Discharge status: Home / Self Care | Payer: Managed Care, Other (non HMO) | Source: Ambulatory Visit | Attending: Radiation Oncology | Admitting: Radiation Oncology

## 2016-10-22 ENCOUNTER — Ambulatory Visit: Payer: Managed Care, Other (non HMO)

## 2016-10-22 VITALS — BP 131/50 | HR 66 | Temp 98.4°F | Resp 18 | Wt 297.8 lb

## 2016-10-22 DIAGNOSIS — C020 Malignant neoplasm of dorsal surface of tongue: Secondary | ICD-10-CM

## 2016-10-22 DIAGNOSIS — Z510 Encounter for antineoplastic radiation therapy: Secondary | ICD-10-CM | POA: Diagnosis not present

## 2016-10-22 NOTE — Progress Notes (Signed)
Orthostatic vitals stable. Six pound weight loss noted in six days. Seen by Dory Peru yesterday. Reports throat, gum and tongue pain 6 on a scale of 0-10. Requesting refill of Fentanyl patches. Mild mucositis noted. Requesting refill of Muguard. Reports he continues to instill eight cans of osmolite per PEG tube. Reports he is able to take pills and water by mouth. Dry mouth continues. Hyperpigmentation of anterior neck noted without desquamation. Understands to continues use of Sonafine bid for the next two weeks. Reports mild fatigue continues. One month follow up appointment card given.   BP (!) 131/50 (BP Location: Right Arm, Patient Position: Standing, Cuff Size: Large)   Pulse 66   Temp 98.4 F (36.9 C) (Oral)   Resp 18   Wt 297 lb 12.8 oz (135.1 kg)   SpO2 98%   BMI 41.53 kg/m  Wt Readings from Last 3 Encounters:  10/22/16 297 lb 12.8 oz (135.1 kg)  10/16/16 (!) 303 lb 9.6 oz (137.7 kg)  10/09/16 (!) 308 lb (139.7 kg)

## 2016-10-22 NOTE — Progress Notes (Signed)
  Radiation Oncology         [phone]   Name: Randy [name] MRN: [id number]   Date: 10/22/2016  DOB: [birth year]     Weekly Radiation Therapy Management    ICD-9-CM ICD-10-CM   1. 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  Orthostatic vitals stable. Six pound weight loss noted in six days. Seen by Dory Peru yesterday. Reports throat, gum and tongue pain 6/10. Requesting refill of Fentanyl patches. Mild mucositis noted. Requesting refill of Muguard. Reports he continues to instill eight cans of osmolite per PEG tube. Reports he is able to take pills and water by mouth. Dry mouth continues. Hyperpigmentation of anterior neck noted without desquamation. Understands to continues use of Sonafine bid for the next two weeks. Reports mild fatigue continues.   Set-up films were reviewed. The chart was checked.  Physical Findings Vitals with BMI 10/22/2016  Height   Weight   BMI   Systolic [id number]  Diastolic 50  Pulse 66  Respirations   Weight stable. Alert, in no acute distress.  Impression The patient is tolerating radiation.  Plan We will continue treatment as planned.One month follow up appointment card given.   ------------------------------------------------   Tyler Pita, MD [place] Director and Director of Stereotactic Radiosurgery Direct Dial: [phone]  Fax: [phone] [email or web address]  Skype  LinkedIn  This document serves as a record of services personally performed by Tyler Pita, MD. It was created on his behalf by Bethann Humble, a trained medical scribe. The creation of this record is based on the scribe's personal observations and the provider's statements to them. This document has been checked and approved by the attending provider.

## 2016-10-23 ENCOUNTER — Telehealth: Payer: Self-pay | Admitting: Radiation Oncology

## 2016-10-23 ENCOUNTER — Ambulatory Visit
Admission: RE | Admit: 2016-10-23 | Discharge: 2016-10-23 | Disposition: A | Discharge status: Home / Self Care | Payer: Managed Care, Other (non HMO) | Source: Ambulatory Visit | Attending: Radiation Oncology | Admitting: Radiation Oncology

## 2016-10-23 ENCOUNTER — Telehealth: Payer: Self-pay | Admitting: *Deleted

## 2016-10-23 ENCOUNTER — Other Ambulatory Visit: Payer: Self-pay | Admitting: Radiation Oncology

## 2016-10-23 ENCOUNTER — Ambulatory Visit: Payer: Managed Care, Other (non HMO)

## 2016-10-23 DIAGNOSIS — C020 Malignant neoplasm of dorsal surface of tongue: Secondary | ICD-10-CM

## 2016-10-23 DIAGNOSIS — Z510 Encounter for antineoplastic radiation therapy: Secondary | ICD-10-CM | POA: Diagnosis not present

## 2016-10-23 MED ORDER — FENTANYL 12 MCG/HR TD PT72: 12.5000 ug | MEDICATED_PATCH | TRANSDERMAL | 0 refills | Status: AC

## 2016-10-23 MED ORDER — MAGIC MOUTHWASH W/LIDOCAINE: 5.0000 mL | Freq: Three times a day (TID) | ORAL | 0 refills | Status: AC | PRN

## 2016-10-23 MED ORDER — MUGARD MT LIQD: OROMUCOSAL | 2 refills | Status: AC

## 2016-10-23 MED FILL — MAGIC MOUTHWASH W/LIDO 1:1: 20 days supply | Qty: 300 | Fill #0

## 2016-10-23 NOTE — Telephone Encounter (Addendum)
To [facility] FentanylOncology Nurse Navigator Documentation  Spoke with Mr. Chess to inform:  Muguard Rx called in  I have Fentanyl Rx for his pick-up on Monday. Later called him to inform:  MMW Rx has been called to [place] as [facility] [place] does not currently have lidocaine in stock, not sure of arrival of next shipment.  Per Dr. Tammi Klippel, he can use 1:1 Lidocaine 2% solution:water as a mouth/throat rinse for mild pain.  Gayleen Orem, RN, BSN, [place] [facility]ck Oncology Nurse [place] at [place] [phone]

## 2016-10-23 NOTE — Telephone Encounter (Signed)
Per Dr. Johny Shears order called in magic mouthwash to [facility] [place]. Gayleen Orem, RN contacted patient via phone making him aware this was done.

## 2016-10-23 NOTE — Progress Notes (Signed)
Oncology Nurse Navigator Documentation  Met with Randy [name] during [name] with Dr. Tammi Klippel. He is scheduled to complete adjuvant RT for L tongue ISCC next Tuesday. He did not express any needs or concerns at this time, I encouraged him to contact me if that changes, he verbalized understanding.  Gayleen Orem, RN, BSN, [place] [facility]ck Oncology Nurse [place] at [place] [phone]

## 2016-10-23 NOTE — Telephone Encounter (Signed)
[facility] [place] phoned to inform this RN they are unable to fill script because they have no lidocaine and no projected date of delivery. Phoned Magic Mouthwash script to [facility]. Phoned patient and informed him of the change.

## 2016-10-26 ENCOUNTER — Ambulatory Visit
Admission: RE | Admit: 2016-10-26 | Discharge: 2016-10-26 | Disposition: A | Discharge status: Home / Self Care | Payer: Managed Care, Other (non HMO) | Source: Ambulatory Visit | Attending: Radiation Oncology | Admitting: Radiation Oncology

## 2016-10-26 ENCOUNTER — Ambulatory Visit: Payer: Managed Care, Other (non HMO)

## 2016-10-26 ENCOUNTER — Encounter: Payer: Self-pay | Admitting: *Deleted

## 2016-10-26 VITALS — BP 145/53 | HR 66 | Temp 98.7°F | Resp 10

## 2016-10-26 DIAGNOSIS — K1233 Oral mucositis (ulcerative) due to radiation: Secondary | ICD-10-CM

## 2016-10-26 DIAGNOSIS — Z510 Encounter for antineoplastic radiation therapy: Secondary | ICD-10-CM | POA: Diagnosis not present

## 2016-10-26 DIAGNOSIS — C020 Malignant neoplasm of dorsal surface of tongue: Secondary | ICD-10-CM

## 2016-10-26 MED ORDER — FLUCONAZOLE 100 MG PO TABS: 100.0000 mg | ORAL_TABLET | Freq: Every day | ORAL | 0 refills | Status: AC

## 2016-10-26 MED ORDER — OXYCODONE HCL 5 MG PO TABS: 5.0000 mg | ORAL_TABLET | ORAL | 0 refills | Status: AC | PRN

## 2016-10-26 MED ORDER — OXYCONTIN 20 MG PO T12A: 20.0000 mg | EXTENDED_RELEASE_TABLET | Freq: Two times a day (BID) | ORAL | 0 refills | Status: AC

## 2016-10-26 MED FILL — oxyCODONE HCL 5 MG TABS: 5 | 5 days supply | Qty: 30 | Fill #0

## 2016-10-26 MED FILL — FLUCONAZOLE 100 MG TABLET: 100 | 10 days supply | Qty: 10 | Fill #0

## 2016-10-26 NOTE — Progress Notes (Signed)
PAIN: He rates his pain as a 8 on a scale of 0-10. constant, sharp and stabbing over mouth/tongue  SWALLOWING/DIET: Pt only takes water and medication orally. Pt reports a formula-Osmolite 1.5,  8 cans per day, 120 ml H20 flush. Peg tube site appearance-Dried brown drainage, moderate amount, pink site. Oral exam reveals moderate erythema and mucous membranes moist with white, yellow and blood streaked sputum.  BOWEL: Pt reports, a bowel movement every day. SKIN: Skin exam reveals dry desquamation and erythema. Pt continues to apply Sonafine as directed.  WEIGHT/VS: BP (!) 145/53   Pulse 66   Temp 98.7 F (37.1 C) (Oral)   Resp 10   SpO2 95%   Ortho VS: 144/53 Pox-96 P-72 Wt Readings from Last 3 Encounters:  10/22/16 297 lb 12.8 oz (135.1 kg)  10/16/16 (!) 303 lb 9.6 oz (137.7 kg)  10/09/16 (!) 308 lb (139.7 kg)

## 2016-10-26 NOTE — Progress Notes (Signed)
  Radiation Oncology         [phone]   Name: Randy [name] MRN: [id number]   Date: 10/26/2016  DOB: July 15, 1956   Weekly Radiation Therapy Management    ICD-9-CM ICD-10-CM   1. 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 Cancer of dorsal tongue ([place]) 141.1 C02.0     Narrative The patient presents for a work in visit due to concerns with increasing mouth pain with a history of mucositis which required IV hydration, PEG placement for enteral feedings, and has been using aqueous vitamin e and mugard. He reports he has noticed more trouble with pain with eating in the last two days. He thinks he has an infection of his tongue.   Physical Findings  oral temperature is 98.7 F (37.1 C). His blood pressure is 145/53 (abnormal) and his pulse is 66. His respiration is 10 and oxygen saturation is 95%.   In general this is a chronically ill appearing caucasian male in no acute distress. He's alert and oriented x4 and appropriate throughout the examination. Cardiopulmonary assessment is negative for acute distress and he exhibits normal effort. HEENT reveals normocephalic, atraumatic. EOMs are intact. Skin of the upper neck and base of chin is erythematous, dry, and flaking, consistent with mild radiation dermatitis without desquamation. The oral mucosa is intact with several hyperemic ulcers along bilateral buccal mucosa, no necrosis is seen as previous. No lesions of the lips are noted. No bleeding is noted. On the tongue however there is patchy white plaquing consistent with candida. The PEG site is intact, there is hyperpigmentation along the site of tape placement over the site. No cellulitic changes are seen.   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  Plan The patient will continue his radiation treatments, he understands it is possible that he may continue to have mucositis related to treatment for up to 2-3 weeks after completion of  treatment. His treatment machine had malfunction and is being evaluated and we anticipate completion of today's treatment this afternoon. He will finish tomorrow to complete his course. I have also given him refills of pain medication and a prescription for diflucan. He was encouraged to continue his tube feeds, and vitamin e with mugard.      Carola Rhine, PAC

## 2016-10-26 NOTE — Progress Notes (Signed)
Oncology Nurse Navigator Documentation  Provided Fentanyl Rx to Randy [name] prior to [facility] RT.  Gayleen Orem, RN, BSN, [place] [facility]ck Oncology Nurse [place] at [place] [phone]

## 2016-10-27 ENCOUNTER — Ambulatory Visit
Admission: RE | Admit: 2016-10-27 | Payer: Managed Care, Other (non HMO) | Source: Ambulatory Visit | Admitting: Radiation Oncology

## 2016-10-27 ENCOUNTER — Ambulatory Visit: Payer: Managed Care, Other (non HMO)

## 2016-10-27 ENCOUNTER — Encounter: Payer: Self-pay | Admitting: *Deleted

## 2016-10-27 ENCOUNTER — Ambulatory Visit
Admission: RE | Admit: 2016-10-27 | Discharge: 2016-10-27 | Disposition: A | Discharge status: Home / Self Care | Payer: Managed Care, Other (non HMO) | Source: Ambulatory Visit | Attending: Radiation Oncology | Admitting: Radiation Oncology

## 2016-10-27 DIAGNOSIS — Z510 Encounter for antineoplastic radiation therapy: Secondary | ICD-10-CM | POA: Diagnosis not present

## 2016-10-27 NOTE — Progress Notes (Signed)
Oncology Nurse Navigator Documentation  Met with Randy [name] during final RT to offer support and to celebrate end of radiation treatment.  He was accompanied by his wife and daughter. I provided wife with a Certificate of Recognition for her supportive care. I provided post-RT guidance:  Importance of keeping follow-up appts with Nutrition and SLP.  Importance of protecting treatment area from sun.  Continuation of Sonafine application 2-3 times daily. He understands I will facilitate a f/u appt with SLP Garald Balding. I explained that my role as navigator will continue for several more months and that I will be calling and/or joining him during follow-up visits.   I encouraged him to call me with needs/concerns.   They verbalized understanding of information provided.  Gayleen Orem, RN, BSN, [place] at [place] [phone]

## 2016-10-28 ENCOUNTER — Ambulatory Visit: Payer: Managed Care, Other (non HMO)

## 2016-10-29 ENCOUNTER — Encounter: Payer: Self-pay | Admitting: Radiation Oncology

## 2016-10-29 NOTE — Progress Notes (Signed)
  Radiation Oncology         [phone] ________________________________  Name: Randy [name] MRN: [id number]  Date: 10/29/2016  DOB: November 05, 1955  End of Treatment Note  Diagnosis: Cancer of dorsal tongue    Indication for treatment:  Curative      Radiation treatment dates:   09/07/16-10/28/15  Site/dose:  1) Tongue/ 30 Gy in 15 fractions   2)Tongue replan boost / 25.5 Gy in 15 fractions to 55.5 Gy total  Beams/energy:   1) Tomo IMRT/ 6X    2) Tomo IMRT/ 6X  Narrative: The patient tolerated radiation treatment relatively poorly with severe mucositis of the oral cavity and lips. He required PEG.  He was hospitalized for oral infection.  The patient was not treated on 12/18-12/22 while he was an inpatient. Patient had a 6 pound weight loss during treatment. He reported throat, gum, and tongue pain as 6/10.  Plan: The patient has completed radiation treatment. The patient will return to radiation oncology clinic for routine followup in one month. I advised him to call or return sooner if he has any questions or concerns related to his recovery or treatment. ________________________________  Sheral Apley. Tammi Klippel, M.D.   This document serves as a record of services personally performed by Tyler Pita, MD. It was created on his behalf by Bethann Humble, a trained medical scribe. The creation of this record is based on the scribe's personal observations and the provider's statements to them. This document has been checked and approved by the attending provider.

## 2016-11-09 ENCOUNTER — Ambulatory Visit: Payer: Managed Care, Other (non HMO) | Attending: Radiation Oncology

## 2016-11-09 DIAGNOSIS — R1310 Dysphagia, unspecified: Secondary | ICD-10-CM | POA: Diagnosis not present

## 2016-11-09 NOTE — Patient Instructions (Addendum)
SWALLOWING EXERCISES Do these 6 of the 7 days per week until 05-06-17, then 3 times per week afterwards  1. Effortful Swallows - Press your tongue against the roof of your mouth for 3 seconds, then squeeze          the muscles in your neck while you swallow your saliva or a sip of water - Repeat 20 times, 2-3 times a day, and use whenever you eat or drink  2. Masako Swallow - swallow with your tongue sticking out - Stick tongue out past your teeth and gently bite tongue with your teeth - Swallow, while holding your tongue with your teeth - Repeat 20 times, 2-3 times a day *use a wet spoon if your mouth gets dry*  3. Shaker Exercise - head lift - Lie flat on your back in your bed or on a couch without pillows - Raise your head and look at your feet - KEEP YOUR SHOULDERS DOWN - HOLD FOR 45-60 SECONDS, then lower your head back down - Repeat 3 times, 2-3 times a day  4. Mendelsohn Maneuver - "half swallow" exercise - Start to swallow, and keep your Adam's apple up by squeezing hard with the            muscles of the throat - Hold the squeeze for 5-7 seconds and then relax - Repeat 20 times, 2-3 times a day *use a wet spoon if your mouth gets dry*  5. Breath Hold - Say "HUH!" loudly, then hold your breath for 3 seconds at your voice box - Repeat 20 times, 2-3 times a day  6. Chin pushback - Open your mouth  - Place your fist UNDER your chin near your neck, and push back with your fist for 5 seconds - Repeat 10 times, 2-3 times a day   ====================================== Signs of Aspiration Pneumonia   . Chest pain/tightness . Fever (can be low grade) . Cough  o With foul-smelling phlegm (sputum) o With sputum containing pus or blood o With greenish sputum . Fatigue  . Shortness of breath  . Wheezing   **IF YOU HAVE THESE SIGNS, CONTACT YOUR DOCTOR OR GO TO THE EMERGENCY DEPARTMENT OR URGENT CARE AS SOON AS POSSIBLE**

## 2016-11-09 NOTE — Therapy (Signed)
[facility] [address] [place], [place], [address] Phone: [phone]   Fax:  [phone]  Speech Language Pathology Treatment  Patient Details  Name: Randy [name] MRN: [id number] Date of Birth: Oct 16, 1955 Referring Provider: Tyler Pita, MD  Encounter Date: 11/09/2016      End of Session - 11/09/16 1106    Visit Number 2   Number of Visits 7   Date for SLP Re-Evaluation 04/23/17   SLP Start Time 19   SLP Stop Time  1055   SLP Time Calculation (min) 36 min   Activity Tolerance Patient tolerated treatment well      Past Medical History:  Diagnosis Date  . Arthritis    left knee  . Cancer ([employer])   . Chronic kidney disease   . Diverticulitis   . Gout   . Hypertension     Past Surgical History:  Procedure Laterality Date  . colonscopy  11/2014  . GASTROSTOMY N/A 09/28/2016   Procedure: OPEN INSERTION OF GASTROSTOMY TUBE;  Surgeon: Autumn Messing III, MD;  Location: [facility];  Service: General;  Laterality: N/A;  . KNEE ARTHROSCOPY Left   . LAPAROSCOPIC SIGMOID COLECTOMY  2004   Dr Zella Richer, for recurrent diverticulitis  . left partial glossectomy with primary closure and left neck dissection Left 07/06/2016   Dr. Corliss Skains  . NASAL ENDOSCOPY     x 3    There were no vitals filed for this visit.      Subjective Assessment - 11/09/16 1026    Subjective Pt has tried broth (burned his tongue) and pudding (dysgeusia). Pt also complains of xerostomia.               ADULT SLP TREATMENT - 11/09/16 1028      General Information   Behavior/Cognition Alert;Cooperative;Pleasant mood     Treatment Provided   Treatment provided Dysphagia     Dysphagia Treatment   Temperature Spikes Noted No   Oral Cavity - Dentition --  adequate for chewing   Treatment Methods Skilled observation;Therapeutic exercise   Patient observed directly with PO's Yes   Type of PO's observed Dysphagia 1  (puree);Thin liquids   Liquids provided via --  bottle   Oral Phase Signs & Symptoms --  none noted   Pharyngeal Phase Signs & Symptoms --  none noted   Other treatment/comments Pt completed a small fraction of his HEP due to overwhelmed wiht rad treatment. Pt req'd verbal and visual cues occasionally with HEP today. No overt s/s aspiration seen with POs, nor overt s/s aspiration PNA. SLP shared strategies for POs such as alternating two food items instead of multiple bites one, then bites of the other. SLP also educated re: food journal. Pt told SLP 4 overt s/s aspiratoin PNA with modified independence. SLP provided second copy of HEP today.     Assessment / Recommendations / Plan   Plan Continue with current plan of care     Progression Toward Goals   Progression toward goals Progressing toward goals          SLP Education - 11/09/16 1105    Education provided Yes   Education Details HEP, late effects head/neck CA, overt s/s aspiration PNA   Person(s) Educated Patient   Methods Explanation;Demonstration;Verbal cues;Handout   Comprehension Verbalized understanding;Returned demonstration;Need further instruction;Verbal cues required          SLP Short Term Goals - 11/09/16 1108      SLP SHORT TERM GOAL #  1   Title pt will tell SLP why he is completing HEP   Status Achieved     SLP SHORT TERM GOAL #2   Title pt will complete HEP with rare min A over two visits   Time 3   Period --  visits (4 total visits)   Status On-going     SLP SHORT TERM GOAL #3   Title pt will tell SLP 3 overt s/s aspiration PNA   Time 3   Period --  visits (4 total)   Status On-going          SLP Long Term Goals - 11/09/16 1108      SLP LONG TERM GOAL #1   Title pt will tell SLP how a food journal can A pt in returning to more normal consistency foods    Time 4   Period --  visits (5 total)   Status On-going     SLP LONG TERM GOAL #2   Title pt will complete HEP with modified  independence over three sessions   Time 6   Period --  visits (7 total)   Status On-going     SLP LONG TERM GOAL #3   Title pt will tell SLP when data states is greatest chance for muscle fibrosis to begin (3 - 6 months post rad tx) over two sessions   Time 4   Period --  visits (5 total)   Status On-going          Plan - 11/09/16 1106    Clinical Impression Statement Pt with no overt s/s aspiration today with POs. Req'd cues for HEP. Education provided (see "Patient Education" for details). Skilled ST remains needed to cont to assess competion of HEP as well as safety with POs.   Speech Therapy Frequency --  approx once every four weeks   Duration --  6 visits   Treatment/Interventions Aspiration precaution training;Pharyngeal strengthening exercises;Diet toleration management by SLP;Compensatory techniques;Internal/external aids;SLP instruction and feedback;Patient/family education;Cueing hierarchy;Trials of upgraded texture/liquids   Potential to Achieve Goals Good   Consulted and Agree with Plan of Care Patient      Patient will benefit from skilled therapeutic intervention in order to improve the following deficits and impairments:   Dysphagia, unspecified type    Problem List Patient Active Problem List   Diagnosis Date Noted  . Dysphagia   . Dysphagia, oropharyngeal - severe 09/26/2016  . Fever 09/25/2016  . Chronic renal insufficiency 09/25/2016  . Dehydration 09/25/2016  . Acute on chronic kidney failure ([place]) 09/25/2016  . 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 Cancer of dorsal tongue ([place]) 08/03/2016  . Chronic kidney disease, stage 3 04/20/2014  . HYPERGLYCEMIA 01/27/2010  . Hyperlipidemia 01/04/2009  . Gout 01/04/2009  . Obesity 01/04/2009  . ANXIETY 01/04/2009  . ERECTILE DYSFUNCTION 01/04/2009  . Essential hypertension 01/04/2009  . ALLERGIC RHINITIS 01/04/2009  . COLONIC POLYPS, HX OF 01/04/2009    [facility] ,MS,  CCC-SLP  11/09/2016, 11:09 AM  [facility] [address] [place], [place], [address] Phone: [phone]   Fax:  [phone]   Name: Randy [name] MRN: [id number] Date of Birth: Mar 13, 1956

## 2016-11-13 ENCOUNTER — Telehealth: Payer: Self-pay | Admitting: Radiation Oncology

## 2016-11-13 ENCOUNTER — Telehealth: Payer: Self-pay | Admitting: *Deleted

## 2016-11-13 NOTE — Telephone Encounter (Signed)
Received voicemail message from patient's wife, Jerrye Beavers, requesting a return call. Phoned [name] back. She states, "Randy [name] isn't doing well and I am not sure what to do." Upon further investigation learned the patient has lost weight (down to 282 lb) despite instilling 8 cans of Ensure per day. She reports he has nausea and increased gas (belching). She reports the patient has attempted for the last three days to eat something by mouth but, is still unable to due to taste changes. She confirms he continues to drink "a whole lot of water." She confirms he isn't complaining of dizziness or lightheadedness. She reports he isn't sleeping well. She states, "the day before yesterday was really bad he stayed in bed all day and didn't instill any Ensure all day for the first time ever." She reports he complains often of being cold/"freezing." She reports his insurance will no longer cover his oxycontin but, he continues to wear his Fentanyl patch and oxycodone. She fears he is depressed because "he cries a lot." She reports the patient refuses to come in to be evaluated. Explained this RN will inform Dr. Tammi Klippel and Gayleen Orem of these findings then, someone from our office will reach back out with direction.

## 2016-11-13 NOTE — Telephone Encounter (Signed)
Randy [name], Can you reach out and assess?  This doesn't sound like a radiation toxicity, so, I am wondering if we need to bring him back to me, [place], Wadie Lessen, or possibly send to primary care?  See what you think. MM

## 2016-11-13 NOTE — Telephone Encounter (Addendum)
Oncology Nurse Navigator Documentation  In follow-up to patient wife's call to RN Presnell, I called/spoke with Mr. Salzer, asked about his well-being.  He reported:  Felt badly Tuesday, nauseous,  "had some kind of bug", was staying in bed but feels better today.  Back to routine of instilling 8 cans Osmolite daily and drinking more water now that he's feeling better.  Acknowledged feeling "down in the dumps" over the past days but not as much now.  He expressed disappointment post-tmt recovery "taking longer than I expected", e.g taste buds have not returned, fatigued.  He recognizes he completed treatments just 2 weeks ago and that its still early.  I assured him SEs will gradually resolve as time passes. I suggested he meet with LCSW Polo Riley to further process post-tmt concerns, he denied interest.   Continuing pain sides of tongue, mouth rated 3/10, taking 10 mg oxycontin BID, finding relief.  I encouraged him to use 1:1 viscous lidocaine:water as mouth rinse PRN.  He understands I will call him early next week to check on him.  Gayleen Orem, RN, BSN, [place] [facility] Nurse [place] [facility] [place] [phone]

## 2016-11-13 NOTE — Telephone Encounter (Signed)
Randy [name] called for me earlier today to try to persuade him to come him. Patient adamantly refused. I, too, recommended asking Wadie Lessen to call him. Rick plans to call him back Monday and check his status.   Sam

## 2016-11-18 ENCOUNTER — Telehealth: Payer: Self-pay | Admitting: Radiation Oncology

## 2016-11-18 NOTE — Telephone Encounter (Signed)
Phoned patient at home to inquire about status. Patient upbeat and "feeling positive." Patient confirms that last week "was really difficult" but this week things are much better. Reports he is brushing his teeth with his electric tooth brush. Eating by mouth chicken noodle soup, mac and cheese, baked beans and apple sauce. Reports his taste seems to be returning to normal since "not everything taste like a paper bag." Reports he continues to supplement with Ensure. Reports he is no longer wearing a Fentanyl patch to manage his pain. Confirmed appointment with Bryson Ha for March 1 at 0800. Also, confirmed his appointment with Ernestene Kiel and Dr. Enrique Sack for this coming Monday. Denies any needs at this time and expressed appreciation for the call.

## 2016-11-23 ENCOUNTER — Ambulatory Visit (HOSPITAL_COMMUNITY): Payer: Medicaid - Dental | Admitting: Dentistry

## 2016-11-23 ENCOUNTER — Encounter (HOSPITAL_COMMUNITY): Payer: Self-pay | Admitting: Dentistry

## 2016-11-23 ENCOUNTER — Ambulatory Visit: Payer: Managed Care, Other (non HMO) | Admitting: Nutrition

## 2016-11-23 VITALS — BP 135/51 | HR 56 | Temp 97.7°F | Wt 279.0 lb

## 2016-11-23 DIAGNOSIS — R432 Parageusia: Secondary | ICD-10-CM

## 2016-11-23 DIAGNOSIS — K117 Disturbances of salivary secretion: Secondary | ICD-10-CM

## 2016-11-23 DIAGNOSIS — Z923 Personal history of irradiation: Secondary | ICD-10-CM

## 2016-11-23 DIAGNOSIS — C021 Malignant neoplasm of border of tongue: Secondary | ICD-10-CM

## 2016-11-23 DIAGNOSIS — R682 Dry mouth, unspecified: Secondary | ICD-10-CM

## 2016-11-23 NOTE — Patient Instructions (Addendum)
RECOMMENDATIONS: 1. Brush after meals and at bedtime.  Use fluoride at bedtime. 2. Use trismus exercises as directed. 3. Use Biotene Rinse or salt water/baking soda rinses. 4. Multiple sips of water as needed. 5. Return to his primary dentist in 2-3 months for exam and cleaning. No extraction of teeth or periodontal surgery in the primary field of radiation therapy without further discussion with Dr. Tammi Klippel to assess the risk for osteoradionecrosis.   Randy [name], DDS    RADIATION THERAPY AND DECISIONS REGARDING YOUR TEETH  Xerostomia (dry mouth) Your salivary glands may be in the filed of radiation.  Radiation may include all or part of your saliva glands.  This will cause your saliva to dry up and you will have a dry mouth.  The dry mouth will be for the rest of your life unless your radiation oncologist tells you otherwise.  Your saliva has many functions:  Saliva wets your tongue for speaking.  It coats your teeth and the inside of your mouth for easier movement.  It helps with chewing and swallowing food.  It helps clean away harmful acid and toxic products made by the germs in your mouth, therefore it helps prevent cavities.  It kills some germs in your mouth and helps to prevent gum disease.  It helps to carry flavor to your taste buds.  Once you have lost your saliva you will be at higher risk for tooth decay and gum disease.  What can be done to help improve your mouth when there's not enough saliva:  1.  Your dentist may give a prescription for Salagen.  It will not bring back all of your saliva but may bring back some of it.  Also your saliva may be thick and ropy or white and foamy. It will not feel like it use to feel.  2.  You will need to swish with water every time your mouth feels dry.  YOU CANNOT suck on any cough drops, mints, lemon drops, candy, vitamin C or any other products.  You cannot use anything other than water to make your mouth feel less dry.   If you want to drink anything else you have to drink it all at once and brush afterwards.  Be sure to discuss the details of your diet habits with your dentist or hygienist.  Radiation caries: This is decay that happens very quickly once your mouth is very dry due to radiation therapy.  Normally cavities take six months to two years to become a problem.  When you have dry mouth cavities may take as little as eight weeks to cause you a problem.  This is why dental check ups every two months are necessary as long as you have a dry mouth. Radiation caries typically, but not always, start at your gum line where it is hard to see the cavity.  It is therefore also hard to fill these cavities adequately.  This high rate of cavities happens because your mouth no longer has saliva and therefore the acid made by the germs starts the decay process.  Whenever you eat anything the germs in your mouth change the food into acid.  The acid then burns a small hole in your tooth.  This small hole is the beginning of a cavity.  If this is not treated then it will grow bigger and become a cavity.  The way to avoid this hole getting bigger is to use fluoride every evening as prescribed by your dentist.  You have to make sure that your teeth are very clean before you use the fluoride.  This fluoride in turn will strengthen your teeth and prepare them for another day of fighting acid.  If you develop radiation caries many times the damage is so large that you will have to have all your teeth removed.  This could be a big problem if some of these teeth are in the field of radiation.  Further details of why this could be a big problem will follow.  (See Osteoradionecrosis).  Loss of taste (dysgeusia) This happens to varying degrees once you've had radiation therapy to your jaw region.  Many times taste is not completely lost but becomes limited.  The loss of taste is mostly due to radiation affecting your taste buds.  However if you  have no saliva in your mouth to carry the flavor to your taste buds it would be difficult for your taste buds to taste anything.  That is why using water or a prescription for Salagen prior to meals and during meals may help with some of the taste.  Keep in mind that taste generally returns very slowly over the course of several months or several years after radiation therapy.  Don't give up hope.  Trismus According to your Radiation Oncologist your TMJ or jaw joints are going to be partially or fully in the field of radiation.  This means that over time the muscles that help you open and close your mouth may get stiff.  This will potentially result in your not being able to open your mouth wide enough or as wide as you can open it now.  Le me give you an example of how slowly this happens and how unaware people are of it.  A gentlemen that had radiation therapy two years ago came back to me complaining that bananas are just too large for him to be able to fit them in between his teeth.  He was not able to open wide enough to bite into a banana.  This happens slowly and over a period of time.  What do we do to try and prevent this?  Your dentist will probably give you a stack of sticks called a trismus exercise device .  This stack will help your remind your muscles and your jaw joint to open up to the same distance every day.  Use these sticks every morning when you wake up according to the instructions given by the dentist.   You must use these sticks for at least one to two years after radiation therapy.  The reason for that is because it happens so slowly and keeps going on for about two years after radiation therapy.  Your hospital dentist will help you monitor your mouth opening and make sure that it's not getting smaller.  Osteoradionecrosis (ORN) This is a condition where your jaw bone after having had radiation therapy becomes very dry.  It has very little blood supply to keep it alive.  If you  develop a cavity that turns into an abscess or an infection then the jaw bone does not have enough blood supply to help fight the infection.  At this point it is very likely that the infection could cause the death of your jaw bone.  When you have dead bone it has to be removed.  Therefore you might end up having to have surgery to remove part of your jaw bone, the part of the jaw bone that has  been affected.   Healing is also a problem if you are to have surgery in the areas where the bone has had radiation therapy.  The same reasons apply.  If you have surgery you need more blood supply which is not available.  When blood supply and oxygen are not available again, there is a chance for the bone to die.  Occasionally ORN happens on its own with no obvious reason.  This is quite rare.  We believe that patients who continue to smoke and/or drink alcohol have a higher chance of having this bone problem.  Therefore once your jaw bone has had radiation therapy if there are any teeth in that area, you should never have them pulled.  You should also never have any surgery on your teeth or gums in that area unless the oral surgeon or Periodontist is aware of your history of radiation. There is some expensive management techniques that might be used to limit your risks.  The risks for ORN either from infection or spontaneous ( or on it's own) are life long.    TRISMUS  Trismus is a condition where the jaw does not allow the mouth to open as wide as it usually does.  This can happen almost suddenly, or in other cases the process is so slow, it is hard to notice it-until it is too far along.  When the jaw joints and/or muscles have been exposed to radiation treatments, the onset of Trismus is very slow.  This is because the muscles are losing their stretching ability over a long period of time, as long as 2 YEARS after the end of radiation.  It is therefore important to exercise these muscles and joints.  TRISMUS  EXERCISES   Stack of tongue depressors measuring the same or a little less than the last documented MIO (Maximum Interincisal Opening).  Secure them with a rubber band on both ends.  Place the stack in the patient's mouth, supporting the other end.  Allow 30 seconds for muscle stretching.  Rest for a few seconds.  Repeat 3-5 times  For all radiation patients, this exercise is recommended in the mornings and evenings unless otherwise instructed.  The exercise should be done for a period of 2 YEARS after the end of radiation.  MIO should be checked routinely on recall dental visits by the general dentist or the hospital dentist.  The patient is advised to report any changes, soreness, or difficulties encountered when doing the exercises.

## 2016-11-23 NOTE — Progress Notes (Signed)
11/23/2016  Patient Name:   Randy [name] Date of Birth:   06-Dec-1955 Medical Record Number: [id number]  BP (!) 135/51   Pulse (!) 56   Temp 97.7 F (36.5 C) (Oral)   Wt 279 lb (126.6 kg)   BMI 38.91 kg/m   Kostas Veneziano Morreale presents for oral examination after radiation therapy. No chemotherapy. Patient has completed 30 radiation treatments from 09/07/16 thru 10/27/16.  REVIEW OF CHIEF COMPLAINTS:  DRY MOUTH: Yes HARD TO SWALLOW: No  HURT TO SWALLOW: No TASTE CHANGES: Taste is returning slowly SORES IN MOUTH: No TRISMUS: No problems with trismus WEIGHT: 279 pounds down from initial 330 pounds.  HOME OH REGIMEN:  BRUSHING: Twice a day FLOSSING: Once a day RINSING: Rinsing with salt water and baking soda rinses and Biotene rinses. FLUORIDE: The patient has not been using fluoride as instructed. Patient is to start using fluoride on the toothbrush as previously instructed. Patient is aware the risk for radiation caries. TRISMUS EXERCISES:  Maximum interincisal opening: 35 mm.   DENTAL EXAM:  Oral Hygiene:(PLAQUE): Good oral hygiene. LOCATION OF MUCOSITIS: None noted DESCRIPTION OF SALIVA: Decreased saliva. Mild xerostomia. ANY EXPOSED BONE: None noted OTHER WATCHED AREAS: Teeth that are in the primary field of radiation therapy to include tooth numbers 15 and 17 and questionably numbers 2 and 14 that are close to primary field of radiation. DX: Xerostomia and Dysgeusia  RECOMMENDATIONS: 1. Brush after meals and at bedtime.  Use fluoride at bedtime. 2. Use trismus exercises as directed. 3. Use Biotene Rinse or salt water/baking soda rinses. 4. Multiple sips of water as needed. 5. Return to his primary dentist in 2-3 months for exam and cleaning. No extraction of teeth or periodontal surgery in the primary field of radiation therapy without further discussion with Dr. Tammi Klippel to assess the risk for osteoradionecrosis.   Lenn Cal, DDS

## 2016-11-23 NOTE — Progress Notes (Signed)
Nutrition follow-up completed with patient who completed radiation therapy for tonsil cancer. Patient completed his treatment on January 16. Current weight decreased and documented as 279.6 pounds February 12 decreased from 303.6 pounds January 5. Patient reports that he is utilizing 6-7 bottles Osmolite 1.5 via PEG daily with 60 cc free water before and after bolus feedings. Patient reports his taste has improved.  He is tolerating a variety of foods such as eggs, applesauce, hotdogs, soups, macaroni and cheese, salad and pizza. He is trying to eat 3 meals a day and drink boost plus. Patient reports he is having a bowel movement daily or every other day. His tongue is still numb on one side of his mouth. He is not tolerating bread or spicy foods at this time. Patient concerned with when he can have his feeding tube removed.  Nutrition diagnosis: Severe malnutrition, improved.  Intervention: I educated patient to continue strategies for increasing oral intake and eating foods as tolerated and drinking boost plus. He understands that one bottle of boost plus by mouth can substitute for one bottle of Osmolite 1.5 via feeding tube. Educated patient on the importance of increasing volume and variety of foods. Educated patient on strategies for adding moister foods for increased tolerance. Questions were answered.  Teach back method used.  Monitoring, evaluation, goals:  Patient is now tolerating oral intake plus tube feeding to meet greater than 90% of estimated nutrition needs, despite weight loss.  Next visit: Monday, March 5.  **Disclaimer: This note was dictated with voice recognition software. Similar sounding words can inadvertently be transcribed and this note may contain transcription errors which may not have been corrected upon publication of note.**

## 2016-12-01 ENCOUNTER — Telehealth: Payer: Self-pay | Admitting: Radiation Oncology

## 2016-12-01 ENCOUNTER — Encounter: Payer: Self-pay | Admitting: Radiation Oncology

## 2016-12-01 NOTE — Progress Notes (Signed)
Opened in error

## 2016-12-01 NOTE — Telephone Encounter (Signed)
Received voicemail message from patient. Returned his call. Patient requesting a letter to submit to insurance rationalizing the need for therapy so insurance will cover the claim. Explained this RN will type of the letter today. Requested number to fax the letter to and patient reports he will call later today with that information. Also, inquired if patient needs refill of magic mouthwash. Patient confirms he does. Explained this RN will obtain new order for magic mouthwash and fax it to [facility] outpatient pharmacy. Obtained order for magic mouthwash from Shona Simpson, PA-C. Faxed order to [facility] outpatient pharmacy. Fax confirmation of delivery obtained.

## 2016-12-02 ENCOUNTER — Other Ambulatory Visit: Payer: Self-pay | Admitting: Radiation Oncology

## 2016-12-02 ENCOUNTER — Encounter: Payer: Self-pay | Admitting: Radiation Oncology

## 2016-12-02 NOTE — Progress Notes (Signed)
Took [employer] letter to [employer] for [employer].

## 2016-12-02 NOTE — Telephone Encounter (Signed)
Sir. I am not sure what this is acting me to do. I received a fax requesting refill on Monday. I completed it, Deatra Canter signed it, we gave 2 refills, and I faxed it back. Confirmation of fax delivery was obtained. Sam

## 2016-12-03 ENCOUNTER — Encounter (HOSPITAL_COMMUNITY): Payer: Self-pay | Admitting: Dentistry

## 2016-12-03 MED FILL — MAGIC MOUTHWASH BOP FORM: 20 days supply | Qty: 300 | Fill #0

## 2016-12-03 NOTE — Progress Notes (Signed)
Randy [name] 61 y.o. man with Cancer of dorsal tongue radiation completed 10-12-16 one month FU.  Pain Status: No pain has numb feeling in on tongue area of surgery..  Nutritional Status a) intake: Gastrostomy Tube not used in three weeks b) using a feeding tube?: 6-7 bottles Osmolite 1.5 via PEG daily with 60 cc free water before and after bolus feedings. c) weight changes, if any:  Wt Readings from Last 3 Encounters:  12/10/16 273 lb 12.8 oz (124.2 kg)  11/23/16 279 lb (126.6 kg)  11/23/16 279 lb 9.6 oz (126.8 kg)     Swallowing Status: Pt denies dysphagia.reports dry mouth,eating moist foods, eating 3000 calories per day.  Smoking or chewing tobacco?  Has not smoked in 21 years.    Using fluoride trays daily? Using fluoride toothpaste daily.   When was last ENT visit?   When is next ENT visit? Dr.  Corliss Skains saw in January will see in March 2018  Other notable issues, if any:  BP (!) 136/58   Pulse 60   Temp 98.2 F (36.8 C) (Oral)   Resp 18   Ht 5\' 11"  (1.803 m)   Wt 273 lb 12.8 oz (124.2 kg)   SpO2 100%   BMI 38.19 kg/m

## 2016-12-08 ENCOUNTER — Telehealth: Payer: Self-pay | Admitting: Radiation Oncology

## 2016-12-08 NOTE — Telephone Encounter (Signed)
Phoned patient. Informed him that letter justifying need for speech therapy following radiation and surgery have been submitted. Encouraged patient to call with future needs. Confirmed follow up appointment for Thursday with Bryson Ha. Patient verbalized understanding and appreciation for all. Patient states, "I haven't put any ensure in my feeding tube in two weeks because I have been eating so well. "

## 2016-12-10 ENCOUNTER — Encounter: Payer: Self-pay | Admitting: Radiation Oncology

## 2016-12-10 ENCOUNTER — Ambulatory Visit
Admission: RE | Admit: 2016-12-10 | Discharge: 2016-12-10 | Disposition: A | Discharge status: Home / Self Care | Payer: Managed Care, Other (non HMO) | Source: Ambulatory Visit | Attending: Radiation Oncology | Admitting: Radiation Oncology

## 2016-12-10 VITALS — BP 136/58 | HR 60 | Temp 98.2°F | Resp 18 | Ht 71.0 in | Wt 273.8 lb

## 2016-12-10 DIAGNOSIS — E46 Unspecified protein-calorie malnutrition: Secondary | ICD-10-CM | POA: Diagnosis not present

## 2016-12-10 DIAGNOSIS — Z923 Personal history of irradiation: Secondary | ICD-10-CM | POA: Insufficient documentation

## 2016-12-10 DIAGNOSIS — Z931 Gastrostomy status: Secondary | ICD-10-CM | POA: Insufficient documentation

## 2016-12-10 DIAGNOSIS — C029 Malignant neoplasm of tongue, unspecified: Secondary | ICD-10-CM | POA: Insufficient documentation

## 2016-12-10 DIAGNOSIS — K1379 Other lesions of oral mucosa: Secondary | ICD-10-CM | POA: Diagnosis not present

## 2016-12-10 DIAGNOSIS — C020 Malignant neoplasm of dorsal surface of tongue: Secondary | ICD-10-CM

## 2016-12-10 DIAGNOSIS — Z79899 Other long term (current) drug therapy: Secondary | ICD-10-CM | POA: Diagnosis not present

## 2016-12-10 NOTE — Progress Notes (Signed)
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 Tongue/ 30 Gy in 15 fractions 2. Tongue replan boost / 25.5 Gy in 15 fractions to 55.5 Gy total  Narrative:  The patient returns today for routine follow-up.  his course was somewhat delayed due to significant development of mucositis which required IV hydration, inpatient admission, and a delay in his treatment. He was ultimately able to complete his course of treatment                            On review of systems, the patient states  he's been doing much better since we last saw him, he states that he is unable to eat regular food, he does have dry mouth, but continues to sit on water. He is planning to see your medicine for a provider in April. He would like to have his G-tube removed at this time as he has not been using it for at least 2 weeks. No other complaints or verbalized.  ALLERGIES:  has No Known Allergies.  Meds: Current Outpatient Prescriptions  Medication Sig Dispense Refill  . allopurinol (ZYLOPRIM) 300 MG tablet Take 300 mg by mouth daily.    Marland Kitchen ALPRAZolam (XANAX) 0.5 MG tablet Take 0.5 mg by mouth daily.     Marland Kitchen atorvastatin (LIPITOR) 40 MG tablet Take 40 mg by mouth daily.    . citalopram (CELEXA) 10 MG/5ML suspension Take 10 mLs (20 mg total) by mouth daily. 240 mL 3  . docusate (COLACE) 50 MG/5ML liquid Place 5 mLs (50 mg total) into feeding tube 2 (two) times daily. 100 mL 0  . ergocalciferol (VITAMIN D2) 50000 units capsule Take 50,000 Units by mouth every Monday.    . fenofibrate 160 MG tablet Take 160 mg by mouth daily.    . magic mouthwash w/lidocaine SOLN Take 5 mLs by mouth 3 (three) times daily  as needed for mouth pain. 300 mL 0  . Multiple Vitamins-Minerals (MULTIVITAMIN ADULT PO) Take by mouth.    . Oral Wound Care Products (MUGARD) LIQD TAKE 5ML BY MOUTH EVERY 6 HOURS FOR MUCOSITIS AS DIRECTED 240 mL 2  . polyethylene glycol (MIRALAX / GLYCOLAX) packet Take 17 g by mouth daily. 14 each 0  . sennosides (SENOKOT) 8.8 MG/5ML syrup Place 5 mLs into feeding tube 2 (two) times daily. 240 mL 0  . thiamine (VITAMIN B-1) 100 MG tablet Take 1 tablet (100 mg total) by mouth daily. 30 tablet 0  . Vitamin D, Ergocalciferol, (DRISDOL) 50000 units CAPS capsule TAKE ONE CAPSULE BY MOUTH ONCE A WEEK    . vitamin e (AQUASOL E) 15 UNIT/0.3ML SOLN solution Take 2 mLs (100 Units total) by mouth 4 (four) times daily. 300 mL 0  . Diphenhyd-Hydrocort-Nystatin (FIRST-DUKES MOUTHWASH) SUSP TAKE 5 ML'S BY MOUTH 3 TIMES A DAY AS NEEDED FOR MOUTH PAIN (Patient not taking: Reported on 12/10/2016) 300 mL 0  . fentaNYL (DURAGESIC - DOSED MCG/HR) 12 MCG/HR Place 1 patch (12.5 mcg total) onto the skin every 3 (three) days. (Patient not taking: Reported on 12/10/2016) 5 patch 0  . fluconazole (DIFLUCAN) 100  MG tablet Take 1 tablet (100 mg total) by mouth daily. (Patient not taking: Reported on 12/10/2016) 10 tablet 0  . lidocaine (XYLOCAINE) 2 % solution Using new clean q-tip swab, paint the lips up to 4 times daily before oral intake (Patient not taking: Reported on 12/10/2016) 200 mL 3  . Nutritional Supplements (FEEDING SUPPLEMENT, OSMOLITE 1.5 CAL,) LIQD Place 474 mLs into feeding tube 4 (four) times daily. (Patient not taking: Reported on 12/10/2016) 237 mL 180  . Oral Wound Care Products [employer] [place]) LIQD Use as directed 5 mLs in the mouth or throat every 4 (four) hours as needed. (Patient not taking: Reported on 12/10/2016) 240 mL 2  . oxyCODONE (OXY IR/ROXICODONE) 5 MG immediate release tablet Take 1 tablet (5 mg total) by mouth every 4 (four) hours as needed for severe pain. (Patient not taking: Reported on 12/10/2016) 30 tablet 0    . OXYCONTIN 20 MG 12 hr tablet Take 1 tablet (20 mg total) by mouth every 12 (twelve) hours. (Patient not taking: Reported on 12/10/2016) 60 tablet 0  . Wound Dressings (SONAFINE EX) Apply 1 application topically 2 (two) times daily as needed (irritation/wound care).      No current facility-administered medications for this encounter.     Physical Findings:  height is 5\' 11"  (1.803 m) and weight is 273 lb 12.8 oz (124.2 kg). His oral temperature is 98.2 F (36.8 C). His blood pressure is 136/58 (abnormal) and his pulse is 60. His respiration is 18 and oxygen saturation is 100%.  Pain Assessment Pain Score: 0-No pain/10 In general this is a well appearing caucasian male in no acute distress. He's alert and oriented x4 and appropriate throughout the examination. Cardiopulmonary assessment is negative for acute distress and he exhibits normal effort. His oropharynx is intact with an area consistent with mucocele along the left buccal mucosa measuring proximally 6 mm in dimension. No ulceration is seen. No other focal lesions are identified along the buccal or oral mucosal surface. Along the time, there is a hyperkeratotic change without evidence of candida. No evidence of edema of this face, submental, or neck regions are identified.  Lab Findings: Lab Results  Component Value Date   WBC 4.1 09/30/2016   HGB 8.5 (L) 09/30/2016   HCT 26.1 (L) 09/30/2016   MCV 90.6 09/30/2016   PLT 211 09/30/2016     Radiographic Findings: No results found.  Impression/Plan: 1. 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 The patient appears to be doing well since completion of radiotherapy. We reviewed the N cc) guidelines for head and neck cancers pertaining to his staging, recommendations would be for history and physical examination every 3 months for the first year, every 6 months for year 2 through 5, and annually thereafter. We will order his posttreatment CT of the neck and  CT of the chest to rule out disease and to create a new baseline. This will be performed prior to his next visit in September. We will also order a TSH to be performed that day. We discussed the rationale for meeting with physical therapy prior to his next visit as well to evaluate and educate the patient on lymphedema as this risk could be delayed and seen more at the 9 month mark post treatment. He states agreement and understanding, and will follow up with [place] sooner if needed. He will also be seen in his throat in April, and anticipate alternating visits the first year. 2. Mucocele. 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 We will follow this expectantly and defer further management to his dentist and ENT. 3. Protein calorie malnutrition. The patient appears to be doing very well since being able to now tolerate more solid foods. We discussed the rationale for discontinuing his G-tube at this time if he has been able to avoid any nutritional supplementation in the last 2-3 weeks. I will place an order for interventional radiology to remove this. He also has additional findings of and enteral feeds that he would like to donate to the cancer center and we'll contact our nutritionist for this.    Carola Rhine, PAC

## 2016-12-10 NOTE — Addendum Note (Signed)
Encounter addended by: Malena Edman, RN on: 12/10/2016  9:40 AM<BR>    Actions taken: Charge Capture section accepted

## 2016-12-11 ENCOUNTER — Telehealth: Payer: Self-pay | Admitting: *Deleted

## 2016-12-11 NOTE — Telephone Encounter (Signed)
CALLED PATIENT TO INFORM OF APPT. FOR PT ON 12-15-16 - ARRIVAL TIME - 3 PM , @ [facility], SPOKE WITH PATIENT AND HE IS AWARE OF THIS APPT.

## 2016-12-14 ENCOUNTER — Ambulatory Visit: Payer: Managed Care, Other (non HMO) | Attending: Radiation Oncology

## 2016-12-14 ENCOUNTER — Ambulatory Visit: Payer: Managed Care, Other (non HMO) | Admitting: Nutrition

## 2016-12-14 ENCOUNTER — Ambulatory Visit: Payer: Managed Care, Other (non HMO)

## 2016-12-14 DIAGNOSIS — R29898 Other symptoms and signs involving the musculoskeletal system: Secondary | ICD-10-CM | POA: Diagnosis present

## 2016-12-14 DIAGNOSIS — R293 Abnormal posture: Secondary | ICD-10-CM | POA: Diagnosis present

## 2016-12-14 DIAGNOSIS — R1310 Dysphagia, unspecified: Secondary | ICD-10-CM

## 2016-12-14 NOTE — Therapy (Signed)
[facility] [address] [place], [place], [address] Phone: [phone]   Fax:  [phone]  Speech Language Pathology Treatment  Patient Details  Name: Randy [name] MRN: [id number] Date of Birth: 05-20-1956 Referring Provider: Tyler Pita, MD  Encounter Date: 12/14/2016      End of Session - 12/14/16 1020    Visit Number 3   Number of Visits 7   Date for SLP Re-Evaluation 04/23/17   SLP Start Time 0927   SLP Stop Time  1010   SLP Time Calculation (min) 43 min   Activity Tolerance Patient tolerated treatment well      Past Medical History:  Diagnosis Date  . Arthritis    left knee  . Cancer ([place])   . Chronic kidney disease   . Diverticulitis   . Gout   . Hypertension     Past Surgical History:  Procedure Laterality Date  . colonscopy  11/2014  . GASTROSTOMY N/A 09/28/2016   Procedure: OPEN INSERTION OF GASTROSTOMY TUBE;  Surgeon: Autumn Messing III, MD;  Location: [facility];  Service: General;  Laterality: N/A;  . KNEE ARTHROSCOPY Left   . LAPAROSCOPIC SIGMOID COLECTOMY  2004   Dr Zella Richer, for recurrent diverticulitis  . left partial glossectomy with primary closure and left neck dissection Left 07/06/2016   Dr. Corliss Skains  . NASAL ENDOSCOPY     x 3    There were no vitals filed for this visit.      Subjective Assessment - 12/14/16 0945    Subjective "I'm eating everything but bread and tough meats." Pt has water and this gets better - c/o xerostomia.   Currently in Pain? No/denies               ADULT SLP TREATMENT - 12/14/16 0946      General Information   Behavior/Cognition Alert;Cooperative;Pleasant mood     Dysphagia Treatment   Temperature Spikes Noted No   Oral Phase Signs & Symptoms Lunch meat/water none noted   Pharyngeal Phase Signs & Symptoms Lunch meat/water none noted   Other treatment/comments Pt with excellent completion of HEP today, told SLP 3 overt s/s  aspiration PNA and told SLP fibrosis most often occurs in first 6 months. He also shared why a food journal has been beneficial for his return to more normal POs.      Assessment / Recommendations / Plan   Plan Continue with current plan of care  expect d/c in another 1-2 visits     Progression Toward Goals   Progression toward goals Progressing toward goals            SLP Short Term Goals - 12/14/16 1007      SLP SHORT TERM GOAL #1   Title pt will tell SLP why he is completing HEP   Status Achieved     SLP SHORT TERM GOAL #2   Title pt will complete HEP with rare min A over two visits   Baseline 12-14-16   Time 2   Period --  visits (4 total visits)   Status On-going     SLP SHORT TERM GOAL #3   Title pt will tell SLP 3 overt s/s aspiration PNA   Status Achieved          SLP Long Term Goals - 12/14/16 1006      SLP LONG TERM GOAL #1   Title pt will tell SLP how a food journal can A pt in returning  to more normal consistency foods    Period Weeks  visits (5 total)   Status Achieved     SLP LONG TERM GOAL #2   Title pt will complete HEP with modified independence over three sessions   Baseline 12-14-16   Time 5   Period --  visits (7 total)   Status On-going     SLP LONG TERM GOAL #3   Title pt will tell SLP when data states is greatest chance for muscle fibrosis to begin (3 - 6 months post rad tx) over two sessions   Status Achieved          Plan - 12/14/16 1021    Clinical Impression Statement Pt with no overt s/s aspiration today with POs, still having difficulty with bread and tougher meats due to xerostomia. HEP completed with excellent success. Goals were met - see goal update. Skilled ST remains needed for one-two more sessions to cont to assess competion of HEP as well as safety with POs.   Speech Therapy Frequency --  approx once every four weeks   Duration --  6 visits   Treatment/Interventions Aspiration precaution training;Pharyngeal  strengthening exercises;Diet toleration management by SLP;Compensatory techniques;Internal/external aids;SLP instruction and feedback;Patient/family education;Cueing hierarchy;Trials of upgraded texture/liquids   Potential to Achieve Goals Good   Consulted and Agree with Plan of Care Patient      Patient will benefit from skilled therapeutic intervention in order to improve the following deficits and impairments:   Dysphagia, unspecified type    Problem List Patient Active Problem List   Diagnosis Date Noted  . Dysphagia   . Dysphagia, oropharyngeal - severe 09/26/2016  . Fever 09/25/2016  . Chronic renal insufficiency 09/25/2016  . Dehydration 09/25/2016  . Acute on chronic kidney failure ([place]) 09/25/2016  . 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 Cancer of dorsal tongue ([place]) 08/03/2016  . Chronic kidney disease, stage 3 04/20/2014  . HYPERGLYCEMIA 01/27/2010  . Hyperlipidemia 01/04/2009  . Gout 01/04/2009  . Obesity 01/04/2009  . ANXIETY 01/04/2009  . ERECTILE DYSFUNCTION 01/04/2009  . Essential hypertension 01/04/2009  . ALLERGIC RHINITIS 01/04/2009  . COLONIC POLYPS, HX OF 01/04/2009    [facility] ,[place], CCC-SLP  12/14/2016, 10:30 AM  [facility] [address] [place], [place], [address] Phone: [phone]   Fax:  [phone]   Name: Randy [name] MRN: [id number] Date of Birth: [birth year]

## 2016-12-14 NOTE — Progress Notes (Signed)
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 Current weight documented as 271.4 pounds down from 279.6 pounds February 12. Patient reports that since our last visit.  He has stopped using his feeding tube. He continues to try a variety of foods and textures with good success. He is drinking for boost plus a day. Reports taste alterations and appetite are improving. Patient is scheduled to have feeding tube removed on Wednesday this week.  Nutrition diagnosis: Severe malnutrition improved.  Intervention: Educated patient to continue oral nutrition supplements if food intake was not adequate for weight maintenance. Recommended he gradually increase boost plus as oral intake increases. Recommended patient use only 2 pounds per week to promote continued healing. Questions were answered.  Teach back method used.  Monitoring, evaluation, goals: Patient tolerating oral intake plus oral nutrition supplements.  No follow-up is scheduled at this time, however patient agrees to contact me with any problems or questions.  He has my contact information.

## 2016-12-15 ENCOUNTER — Ambulatory Visit: Payer: Managed Care, Other (non HMO) | Admitting: Physical Therapy

## 2016-12-15 DIAGNOSIS — R1310 Dysphagia, unspecified: Secondary | ICD-10-CM | POA: Diagnosis not present

## 2016-12-15 DIAGNOSIS — R29898 Other symptoms and signs involving the musculoskeletal system: Secondary | ICD-10-CM

## 2016-12-15 DIAGNOSIS — R293 Abnormal posture: Secondary | ICD-10-CM

## 2016-12-15 NOTE — Therapy (Signed)
[place], [place], [address] Phone: [phone]   Fax:  [phone]  Physical Therapy Evaluation  Patient Details  Name: Randy [name] MRN: [id number] Date of Birth: [birth year] Referring Provider: Shona Simpson  Encounter Date: 12/15/2016      PT End of Session - 12/15/16 1727    Visit Number 1   Number of Visits 1   PT Start Time [id number]   PT Stop Time 1550   PT Time Calculation (min) 39 min   Activity Tolerance Patient tolerated treatment well   Behavior During Therapy [facility] for tasks assessed/performed      Past Medical History:  Diagnosis Date  . Arthritis    left knee  . Cancer ([place])   . Chronic kidney disease   . Diverticulitis   . Gout   . Hypertension     Past Surgical History:  Procedure Laterality Date  . colonscopy  11/2014  . GASTROSTOMY N/A 09/28/2016   Procedure: OPEN INSERTION OF GASTROSTOMY TUBE;  Surgeon: Autumn Messing III, MD;  Location: [facility];  Service: General;  Laterality: N/A;  . KNEE ARTHROSCOPY Left   . LAPAROSCOPIC SIGMOID COLECTOMY  2004   Dr Zella Richer, for recurrent diverticulitis  . left partial glossectomy with primary closure and left neck dissection Left 07/06/2016   Dr. Corliss Skains  . NASAL ENDOSCOPY     x 3    There were no vitals filed for this visit.       Subjective Assessment - 12/15/16 1515    Subjective "I was told I needed to follow-up with the physical therapist to make sure I wasn't having any problem with my lymphatic system.  I just want to make sure I do everything everybody asks me to do."  For some reason, I cannot get to sleep.   Pertinent History Diagnosed with Lt. dorsal tongue invasive squamous cell carcinoma and left neck lymphadenopathy; had left partial glossectomy and neck dissection 07/06/16 in [place].  Started XRT 09/07/16. Has lost almost 55 lbs. since September.  Finished radiation 10/27/16.  Is still losing 3-4 lbs. a week.   Patient Stated Goals do everything providers tell me to do   Currently in Pain? Yes   Pain Score 3    Pain Location Neck   Pain Orientation Left   Pain Descriptors / Indicators Tender   Aggravating Factors  mashing on it            [facility] PT Assessment - 12/15/16 0001      Assessment   Medical Diagnosis left dorsal tongue invasive squamous cell carcinoma with left neck lymphadenopathy   Referring Provider Shona Simpson   Onset Date/Surgical Date 07/06/16  partial glossectomy and neck dissection   Prior Therapy none     Precautions   Precautions Other (comment)   Precaution Comments cancer precautions     Restrictions   Weight Bearing Restrictions No     Balance Screen   Has the patient fallen in the past 6 months No   Has the patient had a decrease in activity level because of a fear of falling?  No   Is the patient reluctant to leave their home because of a fear of falling?  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 [employer] does some walking, but not extensive due to a bad left knee     Cognition   Overall Cognitive Status Within Functional Limits for tasks assessed     Posture/Postural Control   Posture/Postural Control Postural limitations   Postural Limitations Forward head     AROM   Overall AROM Comments active neck extension limited 50%, sidebend 25%, others WFL; shoulders WFL     Palpation   Patella mobility     Palpation comment soft fleshy tissue at anterior neck with two lines down the neck, the right one feeling slightly full and firm              LYMPHEDEMA/ONCOLOGY QUESTIONNAIRE - 12/15/16 1530      Type   Cancer Type tongue squamous cell     Surgeries   Other  Surgery Date 07/06/16   Number Lymph Nodes Removed 40     Treatment   Past Radiation Treatment Yes   Date 10/27/16     Head and Neck   4 cm superior to sternal notch around neck 44.8 cm   6 cm superior to sternal notch around neck 46 cm   8 cm superior to sternal notch around neck 47.1 cm   Other has lost 50 lbs. since September;                 [facility] Adult PT Treatment/Exercise - 12/15/16 0001      Self-Care   Self-Care Other Self-Care Comments   Other Self-Care Comments  Fashioned a foam chip pack in stockinette, placed it on patient and instructed in its use as well as what effects to look for after having it on for 2-4 hours.                PT Education - 12/15/16 1727    Education provided Yes   Education Details use of foam chip pack for neck compression and what effects to look for after wearing it 2-4 hours a day   Person(s) Educated Patient   Methods Explanation   Comprehension Verbalized understanding                [place] - 12/15/16 1733      CC Long Term Goal  #1   Title Pt. will be knowledgeable about appropriate use of a neck compression garment and the signs to look for after wearing it that might indicate that what he has is neck swelling as opposed to loose tissue after weight loss.   Status Achieved         Head and Neck Clinic Goals - 09/22/16 1100      Patient will be able to verbalize understanding of a home exercise program for cervical range of motion, posture, and walking.    Status Achieved     Patient will be able to verbalize understanding of proper sitting and standing posture.    Status Achieved           Plan - 12/15/16 1727    Clinical Impression Statement This gentleman completed his radiation treatment for head & neck cancer in mid-January.  He has been referred for possible neck lymphedema.  He does have some loose fleshy tissue at anterior neck, with two lines down the front of  his neck and the right one feels a bit full and slightly firm; otherwise tissue there feels  soft.  His circumference measurements are smaller than they were when he was seen in multidisciplinary clinic on 09/22/16 after starting radiation, but he has lost 50-some pounds in total since September, so that would account for the decrease.  Pt. does not necessarily feel that his neck looks or feels different; in this case, it is difficult to tell whether he has some swelling or just loose tissue from having been obese and then losing weight.  He was given a foam chip pack for compression and told that he should check in a mirror after wearing it; if he sees that the size of his neck appears reduced and/or indentations from the foam, those would be indicators that he does indeed have swelling.  At this time we have not planned follow-up treatment, but he was made aware of what we can offer him should he need it in the future.  This is a re-evaluation.   Rehab Potential Good   PT Frequency One time visit   PT Treatment/Interventions ADLs/Self Care Home Management;Patient/family education   PT Next Visit Plan None at this time; patient is to watch his neck and if he feels like he has worsening swelling, to return for follow-up.   PT Home Exercise Plan neck AROM   Consulted and Agree with Plan of Care Patient      Patient will benefit from skilled therapeutic intervention in order to improve the following deficits and impairments:  Postural dysfunction, Decreased range of motion, Decreased knowledge of precautions  Visit Diagnosis: Abnormal posture - Plan: PT plan of care cert/re-cert  Other symptoms and signs involving the musculoskeletal system - Plan: PT plan of care cert/re-cert     Problem List Patient Active Problem List   Diagnosis Date Noted  . Dysphagia   . Dysphagia, oropharyngeal - severe 09/26/2016  . Fever 09/25/2016  . Chronic renal insufficiency 09/25/2016  . Dehydration 09/25/2016    . Acute on chronic kidney failure ([place]) 09/25/2016  . 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 Cancer of dorsal tongue ([place]) 08/03/2016  . Chronic kidney disease, stage 3 04/20/2014  . HYPERGLYCEMIA 01/27/2010  . Hyperlipidemia 01/04/2009  . Gout 01/04/2009  . Obesity 01/04/2009  . ANXIETY 01/04/2009  . ERECTILE DYSFUNCTION 01/04/2009  . Essential hypertension 01/04/2009  . ALLERGIC RHINITIS 01/04/2009  . COLONIC POLYPS, HX OF 01/04/2009    Jonnette Nuon 12/15/2016, 5:40 PM  [facility] [place], [place], [address] Phone: [phone]   Fax:  [phone]  Name: Randy [name] MRN: [id number] Date of Birth: 18-Dec-1955  Serafina Royals, PT 12/15/16 5:41 PM

## 2016-12-16 ENCOUNTER — Ambulatory Visit (HOSPITAL_COMMUNITY)
Admission: RE | Admit: 2016-12-16 | Discharge: 2016-12-16 | Disposition: A | Discharge status: Home / Self Care | Payer: Managed Care, Other (non HMO) | Source: Ambulatory Visit | Attending: Radiation Oncology | Admitting: Radiation Oncology

## 2016-12-16 ENCOUNTER — Encounter (HOSPITAL_COMMUNITY): Payer: Self-pay | Admitting: Diagnostic Radiology

## 2016-12-16 DIAGNOSIS — Z431 Encounter for attention to gastrostomy: Secondary | ICD-10-CM | POA: Diagnosis not present

## 2016-12-16 DIAGNOSIS — C020 Malignant neoplasm of dorsal surface of tongue: Secondary | ICD-10-CM | POA: Insufficient documentation

## 2016-12-16 HISTORY — PX: IR GENERIC HISTORICAL: IMG1180011

## 2016-12-16 MED ORDER — LIDOCAINE VISCOUS 2 % MT SOLN
OROMUCOSAL | Status: AC
  Filled 2016-12-16: qty 15

## 2017-01-08 ENCOUNTER — Telehealth: Payer: Self-pay | Admitting: Radiation Oncology

## 2017-01-08 NOTE — Telephone Encounter (Signed)
Received voicemail message from patient requesting return call. Phoned back. Patient reports tenderness and swelling along left jaw line. Patient reports eating and drinking without difficulty. Patient denies any difficulty breathing. Explained that Dr. Tammi Klippel is out of the office and strongly encouraged patient to contact Dr. Royetta Car office for an evaluation. Patient reports he is scheduled to follow up with Dr. Corliss Skains Monday week. Encouraged him to attempt to obtain an appointment sooner. Patient verbalized understanding.

## 2017-01-25 ENCOUNTER — Ambulatory Visit: Payer: Self-pay

## 2017-01-25 ENCOUNTER — Ambulatory Visit: Payer: Managed Care, Other (non HMO) | Attending: Radiation Oncology

## 2017-03-26 ENCOUNTER — Telehealth: Payer: Self-pay | Admitting: *Deleted

## 2017-03-26 NOTE — Telephone Encounter (Signed)
On 03-26-17 the patient daughter came by pick up medical records.ok per wife and patient

## 2017-05-12 DEATH — deceased

## 2017-06-02 NOTE — Progress Notes (Signed)
Heinz Knuckles Sehgal 61 y.o. man with Cancer of dorsal tongue radiation completed 10-12-16 , review 8-   -18 CT chest,FU.  Pain Status:   Nutritional Status a) intake: Gastrostomy Tube not used in three weeks b) using a feeding tube?: 6-7 bottles Osmolite 1.5 via PEG daily with 60 cc free water before and after bolus feedings. c) weight changes, if any Swallowing Status:  dysphagia  Smoking or chewing tobacco?  Has not smoked in 21 years.    Using fluoride trays daily? Using fluoride toothpaste daily.   When was last ENT visit?   When is next ENT visit? Dr.  Corliss Skains saw in January will see in March 2018  Other notable issues, if any:

## 2017-06-16 ENCOUNTER — Telehealth: Payer: Self-pay | Admitting: *Deleted

## 2017-06-16 NOTE — Telephone Encounter (Signed)
CALLED PATIENT TO ASK ABOUT RESCHEDULING SCAN, LAB AND FU VISIT, LVM FOR A RETURN CALL

## 2017-06-17 ENCOUNTER — Ambulatory Visit
Admission: RE | Admit: 2017-06-17 | Discharge: 2017-06-17 | Disposition: A | Discharge status: Home / Self Care | Payer: Managed Care, Other (non HMO) | Source: Ambulatory Visit | Attending: Radiation Oncology | Admitting: Radiation Oncology

## 2017-06-17 ENCOUNTER — Ambulatory Visit: Payer: Managed Care, Other (non HMO)

## 2017-12-17 IMAGING — DX DG CHEST 1V
1 series · 1 of 1 positions shown · non-contrast
Comparison: None.

CLINICAL DATA: Fever

EXAM:
CHEST 1 VIEW

[chest ap]
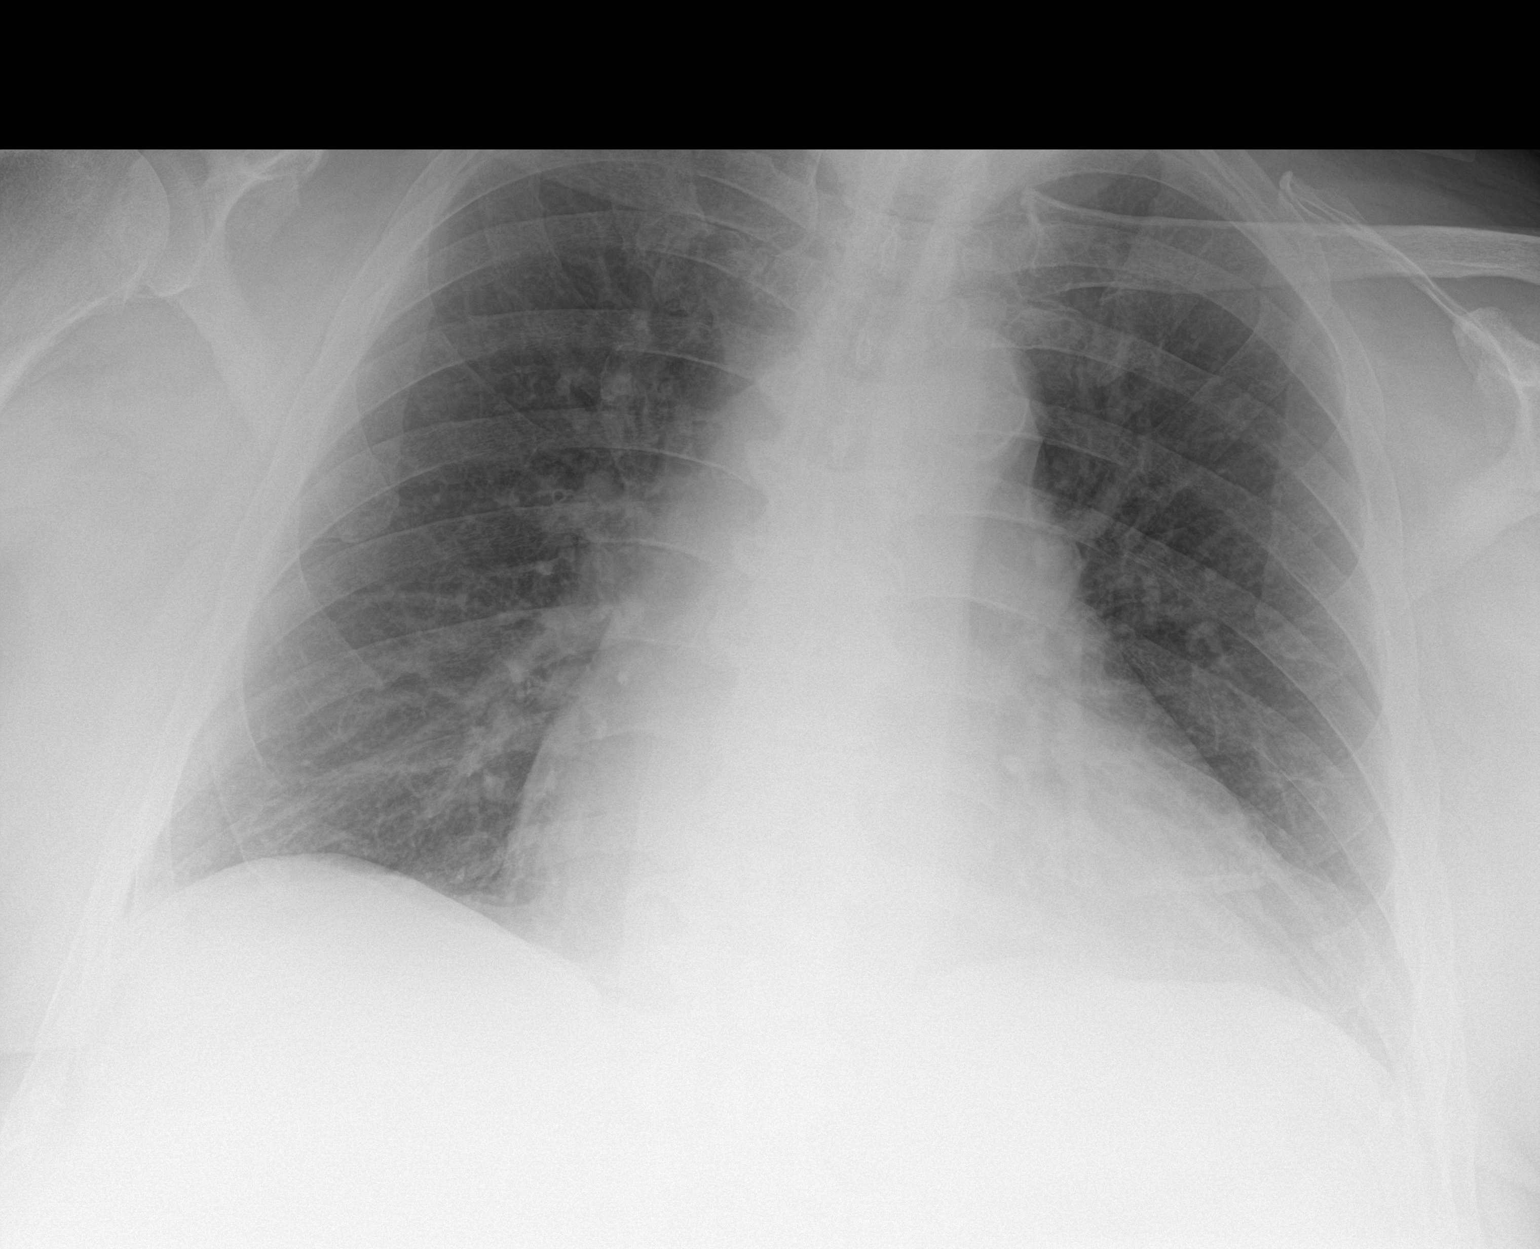

[1 of 1 positions shown; findings below may reference images not displayed]

FINDINGS: Cardiomegaly. No confluent airspace opacities, effusions or edema.
No acute bony abnormality.
IMPRESSION: Cardiomegaly.  No active disease.

## 2017-12-17 IMAGING — CT CT ABDOMEN W/O CM
2 of 4 series · 15 of 46 positions shown, 17 images · non-contrast
Comparison: None.

ADDENDUM:
The CT examination was performed for assessment of anatomy prior to
potential percutaneous gastrostomy tube placement secondary to oral
mucositis from radiation therapy to treat tongue carcinoma. The
study shows unfavorable anatomy for gastrostomy tube placement with
a high stomach present and a high transverse colon which lies
anterior to the majority of the body of the stomach. Surgical
consultation is recommended to determine if the patient would be a
candidate for surgical gastrostomy or jejunostomy.
CLINICAL DATA: Evaluate for G-tube placement.

EXAM:
CT ABDOMEN WITHOUT CONTRAST
TECHNIQUE: Multidetector CT imaging of the abdomen was performed following the
standard protocol without IV contrast.

[Series 2: axial st · axial · 0.98mm/px · z∈[-326,-106]mm · 12 of 50 slices shown, 14 images]
[im 3/50  soft-tissue]
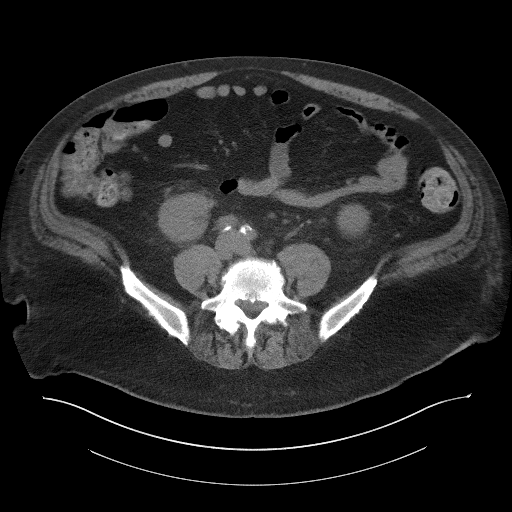
[im 3/50  bone]
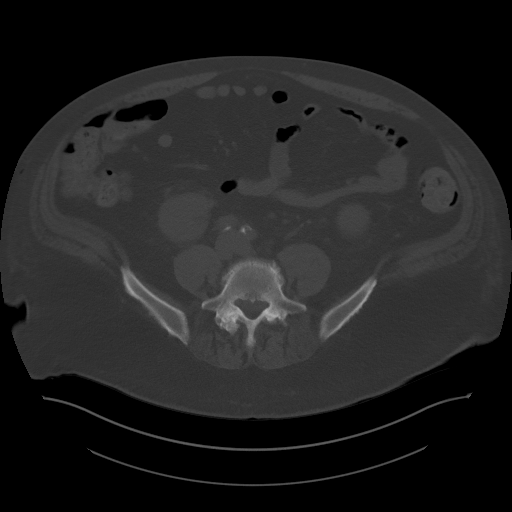
[im 9/50  soft-tissue]
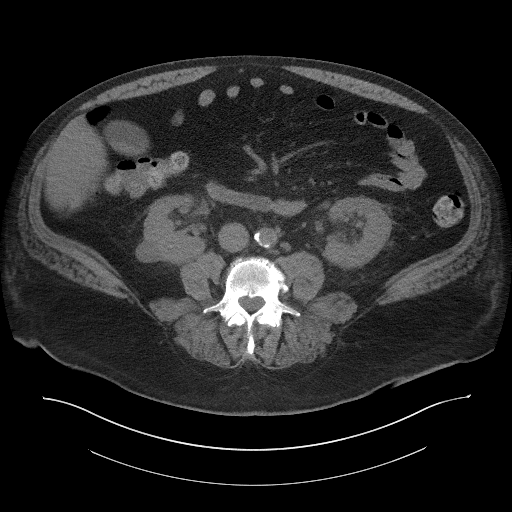
[im 11/50  soft-tissue]
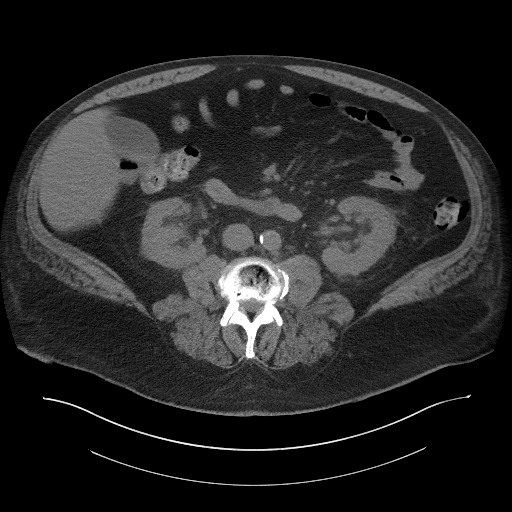
[im 14/50  soft-tissue]
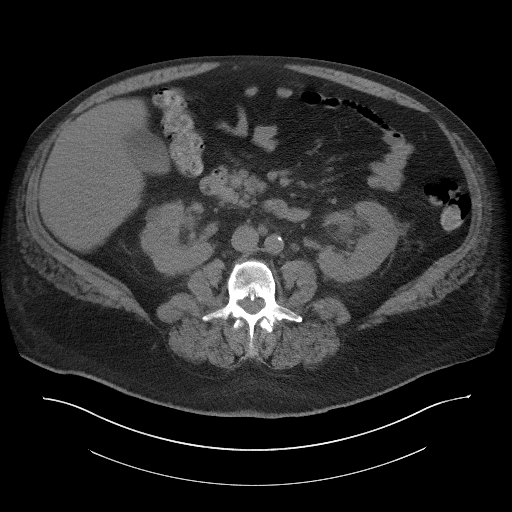
[im 20/50  soft-tissue]
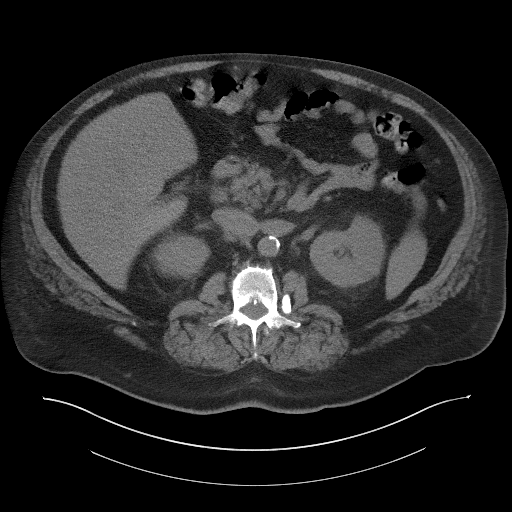
[im 22/50  soft-tissue]
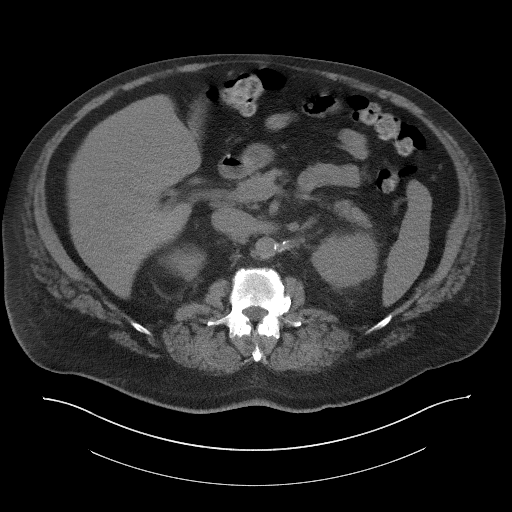
[im 28/50  soft-tissue]
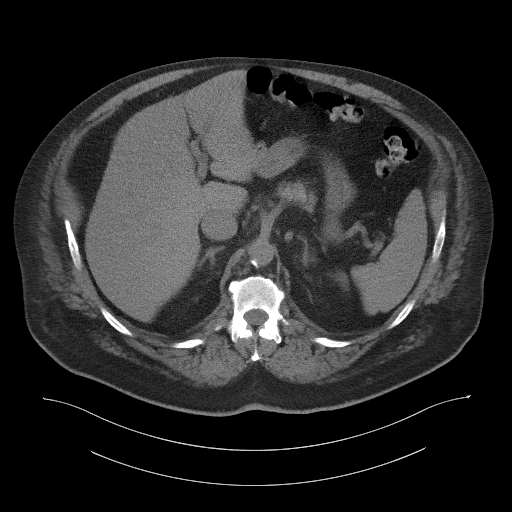
[im 30/50  soft-tissue]
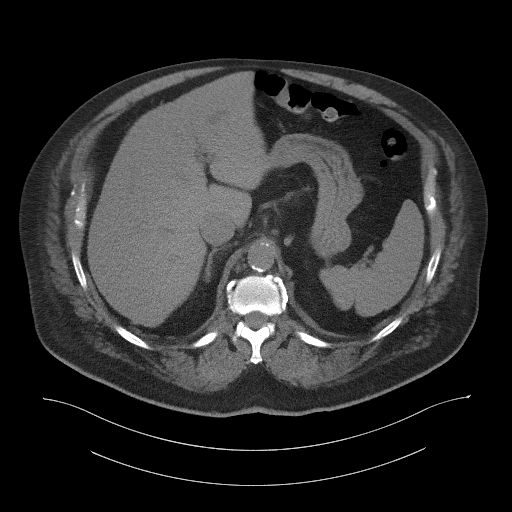
[im 36/50  soft-tissue]
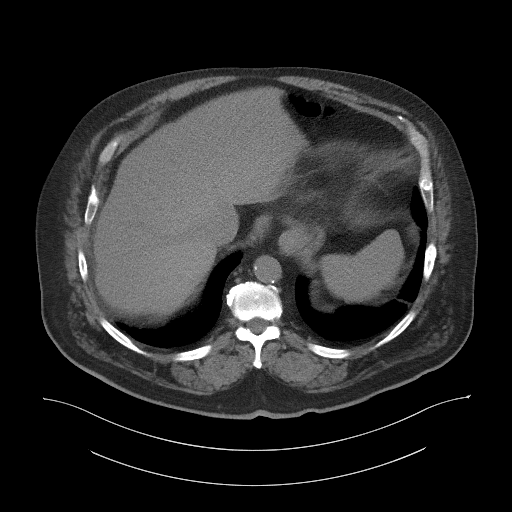
[im 36/50  bone]
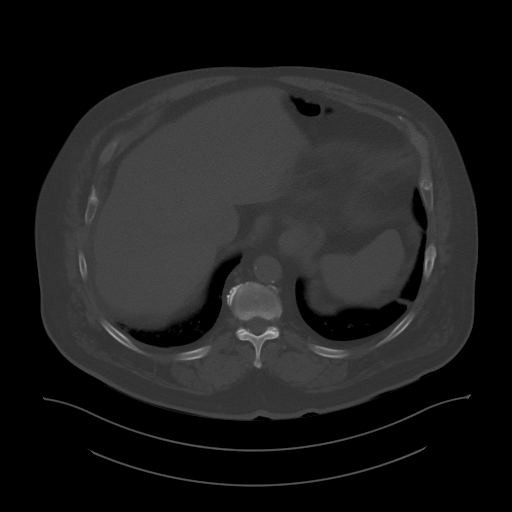
[im 39/50  soft-tissue]
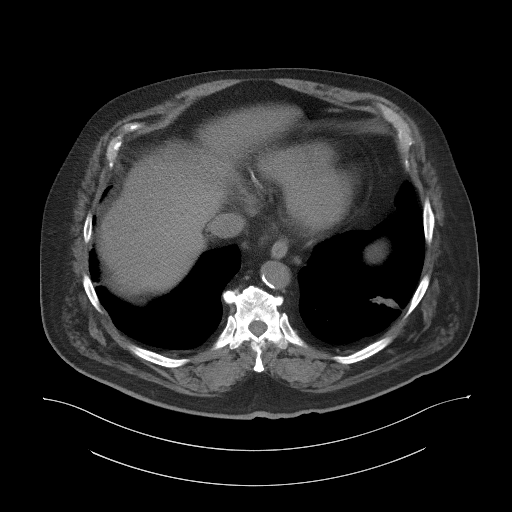
[im 41/50  soft-tissue]
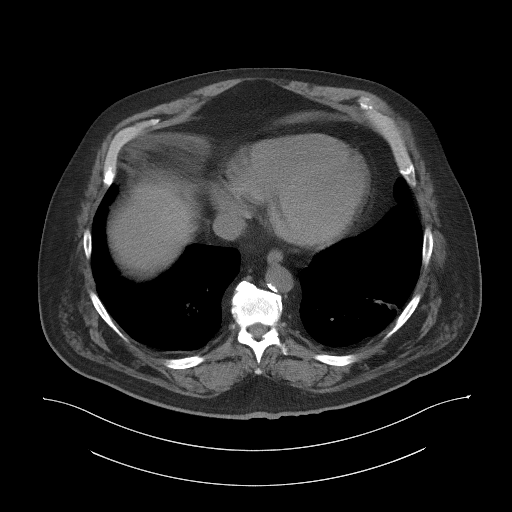
[im 47/50  soft-tissue]
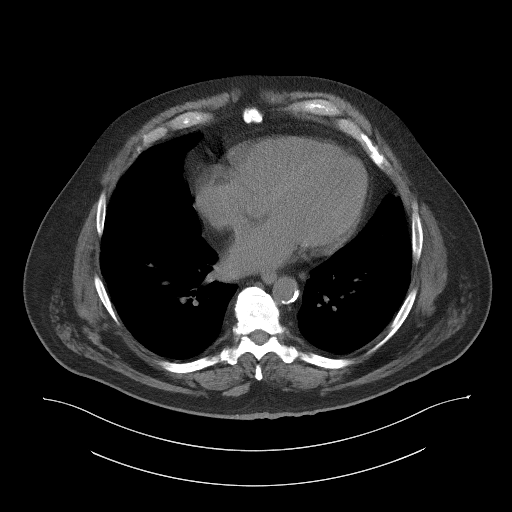

[Series 5: coronal st · coronal · 0.58mm/px · 3 of 124 slices shown]
[im 42/124  soft-tissue]
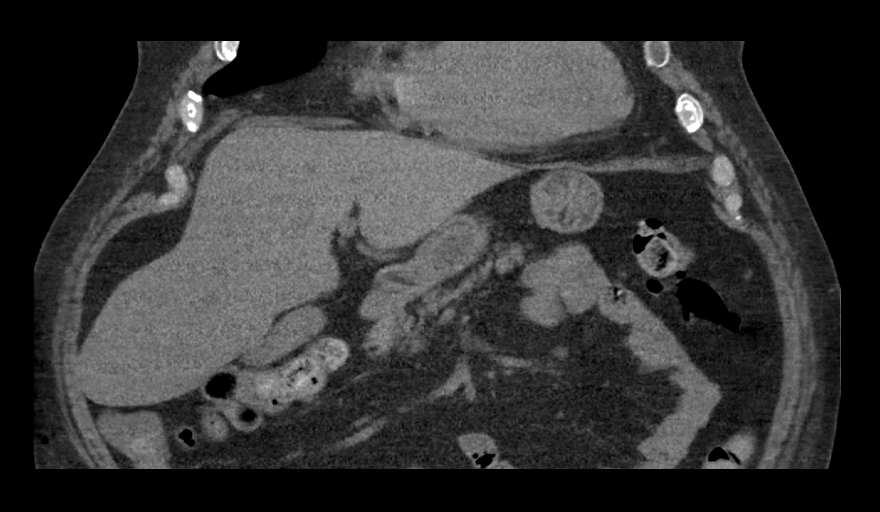
[im 55/124  soft-tissue]
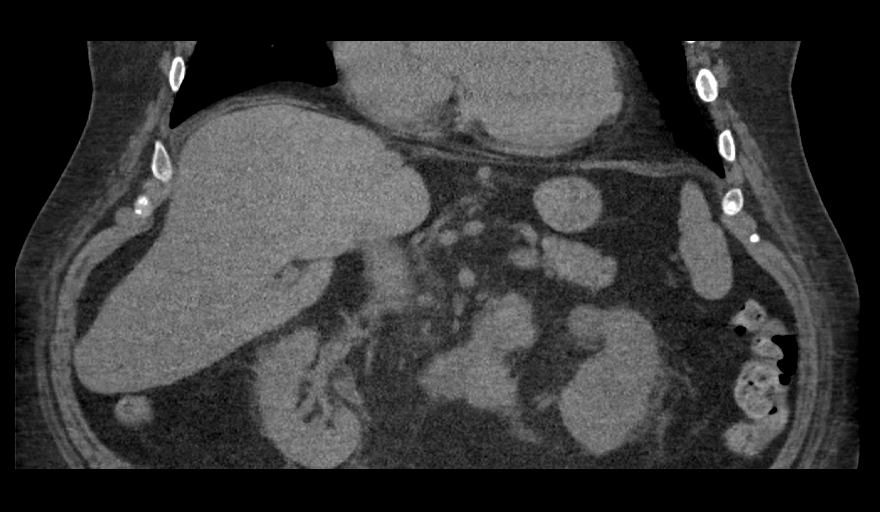
[im 69/124  soft-tissue]
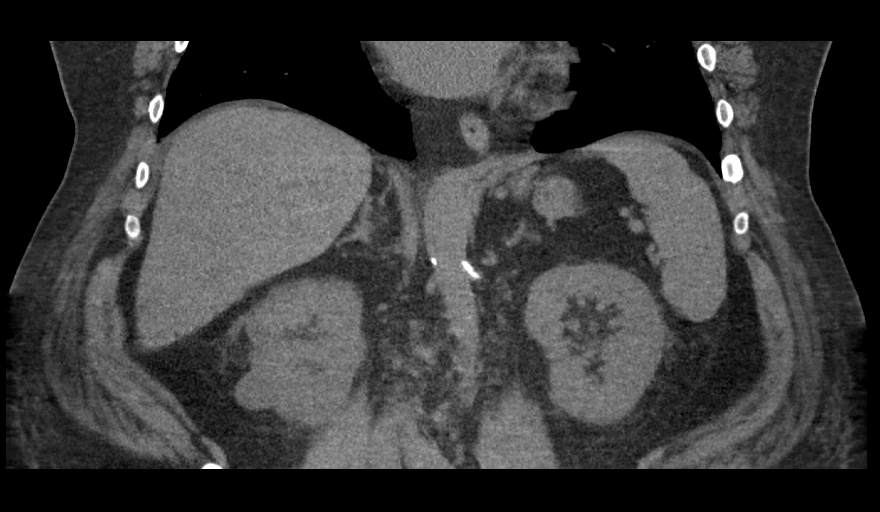

[15 of 46 positions shown; findings below may reference images not displayed]

FINDINGS: Lower chest: Heart is borderline in size. Linear atelectasis or
scarring in the lung bases. No effusions.

Hepatobiliary: No visible focal hepatic abnormality. High-density
layering material in the gallbladder could reflect small layering
stones.

Pancreas: No focal abnormality or ductal dilatation.

Spleen: No focal abnormality.  Normal size.

Adrenals/Urinary Tract: Bilateral perinephric stranding. No
hydronephrosis. Low-density areas in the kidneys bilaterally likely
reflects cysts. Adrenal glands unremarkable.

Stomach/Bowel: Stomach, visualized large and small bowel
decompressed and unremarkable.

Vascular/Lymphatic: Aortic and proximal common iliac calcifications.
No aneurysm. No adenopathy.

Other: No free fluid or free air.

Musculoskeletal: No acute bony abnormality or focal bone lesion.
IMPRESSION: Mild bilateral perinephric stranding. No hydronephrosis. This likely
is related to of prior insults. Bilateral renal cysts.

No acute findings in the abdomen or pelvis.

Aortoiliac atherosclerosis.
